# Patient Record
Sex: Male | Born: 1945 | Marital: Married | Smoking: Never smoker
Health system: Southern US, Community
[De-identification: ages and names within clinical notes are randomized; demographics above are authoritative.]

## PROBLEM LIST (undated history)

## (undated) DIAGNOSIS — J3489 Other specified disorders of nose and nasal sinuses: Secondary | ICD-10-CM

## (undated) DIAGNOSIS — I10 Essential (primary) hypertension: Secondary | ICD-10-CM

## (undated) HISTORY — DX: Other specified disorders of nose and nasal sinuses: J34.89

---

## 2016-09-23 DIAGNOSIS — H25013 Cortical age-related cataract, bilateral: Secondary | ICD-10-CM | POA: Diagnosis not present

## 2016-09-23 DIAGNOSIS — H2513 Age-related nuclear cataract, bilateral: Secondary | ICD-10-CM | POA: Diagnosis not present

## 2016-09-23 DIAGNOSIS — H11153 Pinguecula, bilateral: Secondary | ICD-10-CM | POA: Diagnosis not present

## 2016-09-23 DIAGNOSIS — H40013 Open angle with borderline findings, low risk, bilateral: Secondary | ICD-10-CM | POA: Diagnosis not present

## 2016-10-25 DIAGNOSIS — H40013 Open angle with borderline findings, low risk, bilateral: Secondary | ICD-10-CM | POA: Diagnosis not present

## 2016-11-27 DIAGNOSIS — H25013 Cortical age-related cataract, bilateral: Secondary | ICD-10-CM | POA: Diagnosis not present

## 2016-11-27 DIAGNOSIS — H2511 Age-related nuclear cataract, right eye: Secondary | ICD-10-CM | POA: Diagnosis not present

## 2016-11-27 DIAGNOSIS — H25011 Cortical age-related cataract, right eye: Secondary | ICD-10-CM | POA: Diagnosis not present

## 2016-11-27 DIAGNOSIS — H2513 Age-related nuclear cataract, bilateral: Secondary | ICD-10-CM | POA: Diagnosis not present

## 2016-11-27 DIAGNOSIS — H25043 Posterior subcapsular polar age-related cataract, bilateral: Secondary | ICD-10-CM | POA: Diagnosis not present

## 2016-11-27 DIAGNOSIS — H25041 Posterior subcapsular polar age-related cataract, right eye: Secondary | ICD-10-CM | POA: Diagnosis not present

## 2016-12-30 ENCOUNTER — Other Ambulatory Visit: Payer: Self-pay

## 2016-12-30 ENCOUNTER — Ambulatory Visit (INDEPENDENT_AMBULATORY_CARE_PROVIDER_SITE_OTHER): Payer: Medicare Other | Admitting: Physician Assistant

## 2016-12-30 DIAGNOSIS — M5431 Sciatica, right side: Secondary | ICD-10-CM | POA: Diagnosis not present

## 2016-12-30 DIAGNOSIS — M549 Dorsalgia, unspecified: Secondary | ICD-10-CM | POA: Diagnosis not present

## 2016-12-30 LAB — POCT URINALYSIS DIP (MANUAL ENTRY)
Bilirubin, UA: NEGATIVE
Blood, UA: NEGATIVE
Glucose, UA: NEGATIVE mg/dL
Ketones, POC UA: NEGATIVE mg/dL
Leukocytes, UA: NEGATIVE
Nitrite, UA: NEGATIVE
Protein Ur, POC: NEGATIVE mg/dL
Spec Grav, UA: 1.015 (ref 1.010–1.025)
Urobilinogen, UA: 0.2 E.U./dL
pH, UA: 6 (ref 5.0–8.0)

## 2016-12-30 MED ORDER — KETOROLAC TROMETHAMINE 30 MG/ML IJ SOLN
30.0000 mg | Freq: Once | INTRAMUSCULAR | Status: AC
  Administered 2016-12-30: 30 mg via INTRAMUSCULAR

## 2016-12-30 MED ORDER — METHYLPREDNISOLONE ACETATE 80 MG/ML IJ SUSP
40.0000 mg | Freq: Once | INTRAMUSCULAR | Status: AC
  Administered 2016-12-30: 40 mg via INTRAMUSCULAR

## 2016-12-30 MED ORDER — NAPROXEN 500 MG PO TABS: 500.0000 mg | ORAL_TABLET | Freq: Two times a day (BID) | ORAL | 0 refills | Status: DC

## 2016-12-30 MED ORDER — PREDNISONE 20 MG PO TABS: ORAL_TABLET | ORAL | 0 refills | Status: DC

## 2016-12-30 MED ORDER — TRAMADOL-ACETAMINOPHEN 37.5-325 MG PO TABS: 1.0000 | ORAL_TABLET | Freq: Every day | ORAL | 0 refills | Status: DC

## 2016-12-30 NOTE — Patient Instructions (Addendum)
Ultracet is a pain medication. This may make you drowsy - take this at night to help you sleep. It contains Tylenol. Do not take any other medications containing Tylenol (acetaminophin) while taking this.   Naprosyn is an NSAID antiinflammatory. Do not use with any other otc pain medication other than tylenol/acetaminophen - so no aleve, ibuprofen, motrin, advil, etc.  Prednisone will help relieve inflammation of your sciatica. Take this for 9 days.   These medications will not interact - it is okay to take them together if needed.   Stretch your low back daily. Work on core strengthening exercises, ie isometric exercises.  See below for back stretches and posture. Start stretching once your pain is controlled.   If you are not better in 2-3 weeks, come back.   Thank you for coming in today. I hope you feel we met your needs.  Feel free to call PCP if you have any questions or further requests.  Please consider signing up for MyChart if you do not already have it, as this is a great way to communicate with me.  Best,  Whitney McVey, PA-C   Sciatica Sciatica is pain, numbness, weakness, or tingling along the path of the sciatic nerve. The sciatic nerve starts in the lower back and runs down the back of each leg. The nerve controls the muscles in the lower leg and in the back of the knee. It also provides feeling (sensation) to the back of the thigh, the lower leg, and the sole of the foot. Sciatica is a symptom of another medical condition that pinches or puts pressure on the sciatic nerve. Generally, sciatica only affects one side of the body. Sciatica usually goes away on its own or with treatment. In some cases, sciatica may keep coming back (recur). What are the causes? This condition is caused by pressure on the sciatic nerve, or pinching of the sciatic nerve. This may be the result of:  A disk in between the bones of the spine (vertebrae) bulging out too far (herniated  disk).  Age-related changes in the spinal disks (degenerative disk disease).  A pain disorder that affects a muscle in the buttock (piriformis syndrome).  Extra bone growth (bone spur) near the sciatic nerve.  An injury or break (fracture) of the pelvis.  Pregnancy.  Tumor (rare).  What increases the risk? The following factors may make you more likely to develop this condition:  Playing sports that place pressure or stress on the spine, such as football or weight lifting.  Having poor strength and flexibility.  A history of back injury.  A history of back surgery.  Sitting for long periods of time.  Doing activities that involve repetitive bending or lifting.  Obesity.  What are the signs or symptoms? Symptoms can vary from mild to very severe, and they may include:  Any of these problems in the lower back, leg, hip, or buttock: ? Mild tingling or dull aches. ? Burning sensations. ? Sharp pains.  Numbness in the back of the calf or the sole of the foot.  Leg weakness.  Severe back pain that makes movement difficult.  These symptoms may get worse when you cough, sneeze, or laugh, or when you sit or stand for long periods of time. Being overweight may also make symptoms worse. In some cases, symptoms may recur over time. How is this diagnosed? This condition may be diagnosed based on:  Your symptoms.  A physical exam. Your health care provider may ask  you to do certain movements to check whether those movements trigger your symptoms.  You may have tests, including: ? Blood tests. ? X-rays. ? MRI. ? CT scan.  How is this treated? In many cases, this condition improves on its own, without any treatment. However, treatment may include:  Reducing or modifying physical activity during periods of pain.  Exercising and stretching to strengthen your abdomen and improve the flexibility of your spine.  Icing and applying heat to the affected area.  Medicines  that help: ? To relieve pain and swelling. ? To relax your muscles.  Injections of medicines that help to relieve pain, irritation, and inflammation around the sciatic nerve (steroids).  Surgery.  Follow these instructions at home: Medicines  Take over-the-counter and prescription medicines only as told by your health care provider.  Do not drive or operate heavy machinery while taking prescription pain medicine. Managing pain  If directed, apply ice to the affected area. ? Put ice in a plastic bag. ? Place a towel between your skin and the bag. ? Leave the ice on for 20 minutes, 2-3 times a day.  After icing, apply heat to the affected area before you exercise or as often as told by your health care provider. Use the heat source that your health care provider recommends, such as a moist heat pack or a heating pad. ? Place a towel between your skin and the heat source. ? Leave the heat on for 20-30 minutes. ? Remove the heat if your skin turns bright red. This is especially important if you are unable to feel pain, heat, or cold. You may have a greater risk of getting burned. Activity  Return to your normal activities as told by your health care provider. Ask your health care provider what activities are safe for you. ? Avoid activities that make your symptoms worse.  Take brief periods of rest throughout the day. Resting in a lying or standing position is usually better than sitting to rest. ? When you rest for longer periods, mix in some mild activity or stretching between periods of rest. This will help to prevent stiffness and pain. ? Avoid sitting for long periods of time without moving. Get up and move around at least one time each hour.  Exercise and stretch regularly, as told by your health care provider.  Do not lift anything that is heavier than 10 lb (4.5 kg) while you have symptoms of sciatica. When you do not have symptoms, you should still avoid heavy lifting,  especially repetitive heavy lifting.  When you lift objects, always use proper lifting technique, which includes: ? Bending your knees. ? Keeping the load close to your body. ? Avoiding twisting. General instructions  Use good posture. ? Avoid leaning forward while sitting. ? Avoid hunching over while standing.  Maintain a healthy weight. Excess weight puts extra stress on your back and makes it difficult to maintain good posture.  Wear supportive, comfortable shoes. Avoid wearing high heels.  Avoid sleeping on a mattress that is too soft or too hard. A mattress that is firm enough to support your back when you sleep may help to reduce your pain.  Keep all follow-up visits as told by your health care provider. This is important. Contact a health care provider if:  You have pain that wakes you up when you are sleeping.  You have pain that gets worse when you lie down.  Your pain is worse than you have experienced in the  past.  Your pain lasts longer than 4 weeks.  You experience unexplained weight loss. Get help right away if:  You lose control of your bowel or bladder (incontinence).  You have: ? Weakness in your lower back, pelvis, buttocks, or legs that gets worse. ? Redness or swelling of your back. ? A burning sensation when you urinate. This information is not intended to replace advice given to you by your health care provider. Make sure you discuss any questions you have with your health care provider. Document Released: 01/22/2001 Document Revised: 07/04/2015 Document Reviewed: 10/07/2014 Elsevier Interactive Patient Education  2017 [employer] Ask your health care provider which exercises are safe for you. Do exercises exactly as told by your health care provider and adjust them as directed. It is normal to feel mild stretching, pulling, tightness, or discomfort as you do these exercises, but you should stop right away if you feel sudden pain  or your pain gets worse. Do not begin these exercises until told by your health care provider. Stretching and range of motion exercises These exercises warm up your muscles and joints and improve the movement and flexibility of your back. These exercises also help to relieve pain, numbness, and tingling. Exercise A: Lumbar rotation  1. Lie on your back on a firm surface and bend your knees. 2. Straighten your arms out to your sides so each arm forms an "L" shape with a side of your body (a 90 degree angle). 3. Slowly move both of your knees to one side of your body until you feel a stretch in your lower back. Try not to let your shoulders move off of the floor. 4. Hold for __________ seconds. 5. Tense your abdominal muscles and slowly move your knees back to the starting position. 6. Repeat this exercise on the other side of your body. Repeat __________ times. Complete this exercise __________ times a day. Exercise B: Prone extension on elbows  1. Lie on your abdomen on a firm surface. 2. Prop yourself up on your elbows. 3. Use your arms to help lift your chest up until you feel a gentle stretch in your abdomen and your lower back. ? This will place some of your body weight on your elbows. If this is uncomfortable, try stacking pillows under your chest. ? Your hips should stay down, against the surface that you are lying on. Keep your hip and back muscles relaxed. 4. Hold for __________ seconds. 5. Slowly relax your upper body and return to the starting position. Repeat __________ times. Complete this exercise __________ times a day. Strengthening exercises These exercises build strength and endurance in your back. Endurance is the ability to use your muscles for a long time, even after they get tired. Exercise C: Pelvic tilt 1. Lie on your back on a firm surface. Bend your knees and keep your feet flat. 2. Tense your abdominal muscles. Tip your pelvis up toward the ceiling and flatten your  lower back into the floor. ? To help with this exercise, you may place a small towel under your lower back and try to push your back into the towel. 3. Hold for __________ seconds. 4. Let your muscles relax completely before you repeat this exercise. Repeat __________ times. Complete this exercise __________ times a day. Exercise D: Alternating arm and leg raises  1. Get on your hands and knees on a firm surface. If you are on a hard floor, you may want to use  padding to cushion your knees, such as an exercise mat. 2. Line up your arms and legs. Your hands should be below your shoulders, and your knees should be below your hips. 3. Lift your left leg behind you. At the same time, raise your right arm and straighten it in front of you. ? Do not lift your leg higher than your hip. ? Do not lift your arm higher than your shoulder. ? Keep your abdominal and back muscles tight. ? Keep your hips facing the ground. ? Do not arch your back. ? Keep your balance carefully, and do not hold your breath. 4. Hold for __________ seconds. 5. Slowly return to the starting position and repeat with your right leg and your left arm. Repeat __________ times. Complete this exercise __________ times a day. Exercise E: Abdominal set with straight leg raise  1. Lie on your back on a firm surface. 2. Bend one of your knees and keep your other leg straight. 3. Tense your abdominal muscles and lift your straight leg up, 4-6 inches (10-15 cm) off the ground. 4. Keep your abdominal muscles tight and hold for __________ seconds. ? Do not hold your breath. ? Do not arch your back. Keep it flat against the ground. 5. Keep your abdominal muscles tense as you slowly lower your leg back to the starting position. 6. Repeat with your other leg. Repeat __________ times. Complete this exercise __________ times a day. Posture and body mechanics  Body mechanics refers to the movements and positions of your body while you do  your daily activities. Posture is part of body mechanics. Good posture and healthy body mechanics can help to relieve stress in your body's tissues and joints. Good posture means that your spine is in its natural S-curve position (your spine is neutral), your shoulders are pulled back slightly, and your head is not tipped forward. The following are general guidelines for applying improved posture and body mechanics to your everyday activities. Standing   When standing, keep your spine neutral and your feet about hip-width apart. Keep a slight bend in your knees. Your ears, shoulders, and hips should line up.  When you do a task in which you stand in one place for a long time, place one foot up on a stable object that is 2-4 inches (5-10 cm) high, such as a footstool. This helps keep your spine neutral. Sitting   When sitting, keep your spine neutral and keep your feet flat on the floor. Use a footrest, if necessary, and keep your thighs parallel to the floor. Avoid rounding your shoulders, and avoid tilting your head forward.  When working at a desk or a computer, keep your desk at a height where your hands are slightly lower than your elbows. Slide your chair under your desk so you are close enough to maintain good posture.  When working at a computer, place your monitor at a height where you are looking straight ahead and you do not have to tilt your head forward or downward to look at the screen. Resting   When lying down and resting, avoid positions that are most painful for you.  If you have pain with activities such as sitting, bending, stooping, or squatting (flexion-based activities), lie in a position in which your body does not bend very much. For example, avoid curling up on your side with your arms and knees near your chest (fetal position).  If you have pain with activities such as standing for a  long time or reaching with your arms (extension-based activities), lie with your spine  in a neutral position and bend your knees slightly. Try the following positions:  Lying on your side with a pillow between your knees.  Lying on your back with a pillow under your knees. Lifting   When lifting objects, keep your feet at least shoulder-width apart and tighten your abdominal muscles.  Bend your knees and hips and keep your spine neutral. It is important to lift using the strength of your legs, not your back. Do not lock your knees straight out.  Always ask for help to lift heavy or awkward objects. This information is not intended to replace advice given to you by your health care provider. Make sure you discuss any questions you have with your health care provider. Document Released: 01/28/2005 Document Revised: 10/05/2015 Document Reviewed: 11/09/2014 Elsevier Interactive Patient Education  2018 [employer].  IF you received an x-ray today, you will receive an invoice from [facility]. Please contact [facility] at [phone] with questions or concerns regarding your invoice.   IF you received labwork today, you will receive an invoice from [facility]. Please contact [facility] at [phone] with questions or concerns regarding your invoice.   Our billing staff will not be able to assist you with questions regarding bills from these companies.  You will be contacted with the lab results as soon as they are available. The fastest way to get your results is to activate your My Chart account. Instructions are located on the last page of this paperwork. If you have not heard from [place] regarding the results in 2 weeks, please contact this office.

## 2016-12-30 NOTE — Progress Notes (Signed)
   Ian Ryan  MRN: [id number] DOB: 25-Jul-1945  PCP: Patient, No Pcp Per  Subjective:  Pt is a pleasant 71 year old male who presents to clinic for back pain x 2 days. A few days ago he was bending over to take laundry out of his dryer. He is here today with his son. Pain radiates down side of right leg to the knee. 9/10 pain.  He cannot find a comfortable position for sleeping. Pain is worse with bending, standing from seated position, twisting.Ice makes it feel better. He took 400mg  ibuprofen this morning. He is very active - works all day most days of the week.    Denies loss of bowel or bladder function, saddle paresthesia, blood in urine, flank pain, fever, chills, muscle weakness.   Review of Systems  Cardiovascular: Negative for leg swelling.  Gastrointestinal: Negative for abdominal pain, constipation and diarrhea.  Genitourinary: Negative for difficulty urinating, enuresis and hematuria.  Musculoskeletal: Positive for back pain and gait problem. Negative for neck pain and neck stiffness.  Skin: Negative.   Neurological: Negative for weakness and numbness.  Psychiatric/Behavioral: Positive for sleep disturbance.    There are no active problems to display for this patient.   Current Outpatient Medications on File Prior to Visit  Medication Sig Dispense Refill  . PRESCRIPTION MEDICATION [place] from [place]     No current facility-administered medications on file prior to visit.     No Known Allergies   Objective:  There were no vitals taken for this visit.  Physical Exam  Constitutional: He is oriented to person, place, and time and well-developed, well-nourished, and in no distress. No distress.  Abdominal: There is no CVA tenderness.  Musculoskeletal:       Lumbar back: He exhibits decreased range of motion and tenderness. He exhibits no bony tenderness, no deformity and no pain.       Back:  Difficulty finding position of comfort during exam.    Neurological: He is alert and oriented to person, place, and time. He has normal strength and normal reflexes. He has an abnormal Straight Leg Raise Test (right). GCS score is 15.  Skin: Skin is warm and dry.  Psychiatric: Mood, memory, affect and judgment normal.  Vitals reviewed.   Assessment and Plan :  1. Back pain, unspecified back location, unspecified back pain laterality, unspecified chronicity - POCT urinalysis dipstick - traMADol-acetaminophen (ULTRACET) 37.5-325 MG tablet; Take 1-2 tablets at bedtime by mouth. May cause constipation.  Dispense: 10 tablet; Refill: 0 - naproxen (NAPROSYN) 500 MG tablet; Take 1 tablet (500 mg total) 2 (two) times daily with a meal by mouth.  Dispense: 30 tablet; Refill: 0 - ketorolac (TORADOL) 30 MG/ML injection 30 mg  2. Sciatica of right side - predniSONE (DELTASONE) 20 MG tablet; Take 3 PO QAM x3days, 2 PO QAM x3days, 1 PO QAM x3days  Dispense: 18 tablet; Refill: 0 - methylPREDNISolone acetate (DEPO-MEDROL) injection 40 mg - No concern at this time for cauda equina. Plan to treat for sciatica. Supportive care discussed. Start stretching once pain is controlled. RTC in 2-3 weeks if no improvement. Consider imaging or referral.    Marco CollieWhitney Deanne Bedgood, PA-C  [facility] at [facility] 12/30/2016 5:44 PM

## 2016-12-31 ENCOUNTER — Telehealth: Payer: Self-pay | Admitting: *Deleted

## 2016-12-31 NOTE — Telephone Encounter (Signed)
Spoke with patient advised that Tramadol Rx was sent to his pharmacy.

## 2017-01-07 DIAGNOSIS — H2511 Age-related nuclear cataract, right eye: Secondary | ICD-10-CM | POA: Diagnosis not present

## 2017-01-07 DIAGNOSIS — H25811 Combined forms of age-related cataract, right eye: Secondary | ICD-10-CM | POA: Diagnosis not present

## 2017-01-08 DIAGNOSIS — H2512 Age-related nuclear cataract, left eye: Secondary | ICD-10-CM | POA: Diagnosis not present

## 2017-01-14 DIAGNOSIS — H25812 Combined forms of age-related cataract, left eye: Secondary | ICD-10-CM | POA: Diagnosis not present

## 2017-01-14 DIAGNOSIS — H2512 Age-related nuclear cataract, left eye: Secondary | ICD-10-CM | POA: Diagnosis not present

## 2017-01-14 DIAGNOSIS — H2511 Age-related nuclear cataract, right eye: Secondary | ICD-10-CM | POA: Diagnosis not present

## 2019-03-05 DIAGNOSIS — H401131 Primary open-angle glaucoma, bilateral, mild stage: Secondary | ICD-10-CM | POA: Diagnosis not present

## 2019-10-26 DIAGNOSIS — R413 Other amnesia: Secondary | ICD-10-CM | POA: Diagnosis not present

## 2019-10-26 DIAGNOSIS — N400 Enlarged prostate without lower urinary tract symptoms: Secondary | ICD-10-CM | POA: Diagnosis not present

## 2019-10-26 DIAGNOSIS — I10 Essential (primary) hypertension: Secondary | ICD-10-CM | POA: Diagnosis not present

## 2019-10-26 DIAGNOSIS — Z299 Encounter for prophylactic measures, unspecified: Secondary | ICD-10-CM | POA: Diagnosis not present

## 2019-10-26 DIAGNOSIS — R5383 Other fatigue: Secondary | ICD-10-CM | POA: Diagnosis not present

## 2019-11-08 DIAGNOSIS — H35033 Hypertensive retinopathy, bilateral: Secondary | ICD-10-CM | POA: Diagnosis not present

## 2019-11-08 DIAGNOSIS — H401134 Primary open-angle glaucoma, bilateral, indeterminate stage: Secondary | ICD-10-CM | POA: Diagnosis not present

## 2019-11-08 DIAGNOSIS — H524 Presbyopia: Secondary | ICD-10-CM | POA: Diagnosis not present

## 2019-11-08 DIAGNOSIS — H35013 Changes in retinal vascular appearance, bilateral: Secondary | ICD-10-CM | POA: Diagnosis not present

## 2019-11-08 DIAGNOSIS — Z961 Presence of intraocular lens: Secondary | ICD-10-CM | POA: Diagnosis not present

## 2019-11-18 DIAGNOSIS — Z7189 Other specified counseling: Secondary | ICD-10-CM | POA: Diagnosis not present

## 2019-11-18 DIAGNOSIS — Z1339 Encounter for screening examination for other mental health and behavioral disorders: Secondary | ICD-10-CM | POA: Diagnosis not present

## 2019-11-18 DIAGNOSIS — R5383 Other fatigue: Secondary | ICD-10-CM | POA: Diagnosis not present

## 2019-11-18 DIAGNOSIS — Z79899 Other long term (current) drug therapy: Secondary | ICD-10-CM | POA: Diagnosis not present

## 2019-11-18 DIAGNOSIS — Z0000 Encounter for general adult medical examination without abnormal findings: Secondary | ICD-10-CM | POA: Diagnosis not present

## 2019-11-18 DIAGNOSIS — Z713 Dietary counseling and surveillance: Secondary | ICD-10-CM | POA: Diagnosis not present

## 2019-11-18 DIAGNOSIS — Z1331 Encounter for screening for depression: Secondary | ICD-10-CM | POA: Diagnosis not present

## 2019-11-18 DIAGNOSIS — Z299 Encounter for prophylactic measures, unspecified: Secondary | ICD-10-CM | POA: Diagnosis not present

## 2019-11-18 DIAGNOSIS — Z125 Encounter for screening for malignant neoplasm of prostate: Secondary | ICD-10-CM | POA: Diagnosis not present

## 2019-11-18 DIAGNOSIS — Z1211 Encounter for screening for malignant neoplasm of colon: Secondary | ICD-10-CM | POA: Diagnosis not present

## 2019-11-18 DIAGNOSIS — I10 Essential (primary) hypertension: Secondary | ICD-10-CM | POA: Diagnosis not present

## 2019-12-08 DIAGNOSIS — Z23 Encounter for immunization: Secondary | ICD-10-CM | POA: Diagnosis not present

## 2020-09-03 ENCOUNTER — Encounter (HOSPITAL_COMMUNITY): Payer: Self-pay | Admitting: Emergency Medicine

## 2020-09-03 ENCOUNTER — Emergency Department (HOSPITAL_COMMUNITY): Payer: Medicare Other

## 2020-09-03 ENCOUNTER — Emergency Department (HOSPITAL_COMMUNITY)
Admission: EM | Admit: 2020-09-03 | Discharge: 2020-09-03 | Disposition: A | Discharge status: Home / Self Care | Payer: Medicare Other | Attending: Emergency Medicine | Admitting: Emergency Medicine

## 2020-09-03 DIAGNOSIS — I10 Essential (primary) hypertension: Secondary | ICD-10-CM | POA: Diagnosis not present

## 2020-09-03 DIAGNOSIS — R519 Headache, unspecified: Secondary | ICD-10-CM | POA: Diagnosis present

## 2020-09-03 DIAGNOSIS — H5713 Ocular pain, bilateral: Secondary | ICD-10-CM | POA: Diagnosis not present

## 2020-09-03 HISTORY — DX: Essential (primary) hypertension: I10

## 2020-09-03 LAB — CBC WITH DIFFERENTIAL/PLATELET
Abs Immature Granulocytes: 0.02 10*3/uL (ref 0.00–0.07)
Basophils Absolute: 0 10*3/uL (ref 0.0–0.1)
Basophils Relative: 0 %
Eosinophils Absolute: 0.2 10*3/uL (ref 0.0–0.5)
Eosinophils Relative: 4 %
HCT: 39.5 % (ref 39.0–52.0)
Hemoglobin: 13.2 g/dL (ref 13.0–17.0)
Immature Granulocytes: 0 %
Lymphocytes Relative: 37 %
Lymphs Abs: 2 10*3/uL (ref 0.7–4.0)
MCH: 32.5 pg (ref 26.0–34.0)
MCHC: 33.4 g/dL (ref 30.0–36.0)
MCV: 97.3 fL (ref 80.0–100.0)
Monocytes Absolute: 0.5 10*3/uL (ref 0.1–1.0)
Monocytes Relative: 10 %
Neutro Abs: 2.6 10*3/uL (ref 1.7–7.7)
Neutrophils Relative %: 49 %
Platelets: 41 10*3/uL — ABNORMAL LOW (ref 150–400)
RBC: 4.06 MIL/uL — ABNORMAL LOW (ref 4.22–5.81)
RDW: 12.2 % (ref 11.5–15.5)
WBC: 5.3 10*3/uL (ref 4.0–10.5)
nRBC: 0 % (ref 0.0–0.2)

## 2020-09-03 LAB — COMPREHENSIVE METABOLIC PANEL
ALT: 17 U/L (ref 0–44)
AST: 19 U/L (ref 15–41)
Albumin: 3.8 g/dL (ref 3.5–5.0)
Alkaline Phosphatase: 71 U/L (ref 38–126)
Anion gap: 6 (ref 5–15)
BUN: 9 mg/dL (ref 8–23)
CO2: 26 mmol/L (ref 22–32)
Calcium: 10 mg/dL (ref 8.9–10.3)
Chloride: 103 mmol/L (ref 98–111)
Creatinine, Ser: 1.06 mg/dL (ref 0.61–1.24)
GFR, Estimated: >60 mL/min (ref >60)
Glucose, Bld: 103 mg/dL — ABNORMAL HIGH (ref 70–99)
Potassium: 4.9 mmol/L (ref 3.5–5.1)
Sodium: 135 mmol/L (ref 135–145)
Total Bilirubin: 0.8 mg/dL (ref 0.3–1.2)
Total Protein: 7.4 g/dL (ref 6.5–8.1)

## 2020-09-03 MED ORDER — TETRACAINE HCL 0.5 % OP SOLN
2.0000 [drp] | Freq: Once | OPHTHALMIC | Status: AC
  Administered 2020-09-03: 2 [drp] via OPHTHALMIC
  Filled 2020-09-03: qty 4

## 2020-09-03 MED ORDER — LOSARTAN POTASSIUM 50 MG PO TABS: 75.0000 mg | ORAL_TABLET | Freq: Every day | ORAL | 0 refills | Status: DC

## 2020-09-03 NOTE — ED Provider Notes (Signed)
Emergency Medicine Provider Triage Evaluation Note  Ian Ryan , a 75 y.o. male  was evaluated in triage.  Pt complains of headache and bilateral eye pain.  Has been present for to 5 days.  Nothing makes it better or worse.  He has been taking blood pressure medication as prescribed.  Review of Systems  Positive: Ha, eye pain Negative: Vision changes  Physical Exam  BP (!) 168/74   Pulse 66   Temp 99 F (37.2 C) (Oral)   Resp 14   SpO2 100%  Gen:   Awake, no distress   Resp:  Normal effort  MSK:   Moves extremities without difficulty  Other:  EOMI, CN intact.  Strength and sensation intact x4.  Medical Decision Making  Medically screening exam initiated at 12:11 PM.  Appropriate orders placed.  Ian Ryan was informed that the remainder of the evaluation will be completed by another provider, this initial triage assessment does not replace that evaluation, and the importance of remaining in the ED until their evaluation is complete.  Labs, ct   Alveria Apley, PA-C 09/03/20 1212    Cheryll Cockayne, MD 09/05/20 [phone]

## 2020-09-03 NOTE — ED Triage Notes (Signed)
C/o frontal headache and pain to bilateral eyes x 4-5 days.  Denies nausea, vomiting or any other associated symptoms.

## 2020-09-03 NOTE — ED Notes (Signed)
EDP at bedside with [place].

## 2020-09-03 NOTE — Discharge Instructions (Addendum)
You came to the ED with headache and eye pain for 4-5 days. Your symptoms were intermittent. CT scan of the head was negative for any problems. I believe this may be secondary to a high blood pressure. I would advise you to take 75 mg of losartan until you see your PCP for better BP control. You can take OTC headache medication for your headache (combo acetaminophen, aspirin, caffeine). Follow up with your PCP for further evaluation. If you get chest pain, shortness of breath, severe headache, vision loss please come back to the ED.

## 2020-09-03 NOTE — ED Provider Notes (Signed)
[facility] EMERGENCY DEPARTMENT Provider Note   CSN: [id number] Arrival date & time: 09/03/20  1121     History No chief complaint on file.   Boyd Spatafore is a 75 y.o. male with past medical history of hypertension presenting to the ED for headache and bilateral eye pain for 4-5 days. The pain comes and goes but is worse in the afternoon and at night. The pain is located in the front of the head and in the eyes bilaterally. The headache is rated as 3/10 in intensity and pain in the eye is rated as 6/10. The headache is described as dull pain but eye pain is described as sharper pain. Patient denies any other symptoms but does state he does not check his blood pressure at home. He only took 1 tylenol so far for this headache but states it did not help. He did have a eye exam last month and that was normal. His blood pressure was elevated in the ED with 170/74 when I was in the room.       Past Medical History:  Diagnosis Date   Hypertension     There are no problems to display for this patient.   History reviewed. No pertinent surgical history.     No family history on file.  Social History   Tobacco Use   Smoking status: Never   Smokeless tobacco: Never  Substance Use Topics   Alcohol use: Not Currently   Drug use: Not Currently    Home Medications Prior to Admission medications   Medication Sig Start Date End Date Taking? Authorizing Provider  naproxen (NAPROSYN) 500 MG tablet Take 1 tablet (500 mg total) 2 (two) times daily with a meal by mouth. 12/30/16   McVey, Madelaine Bhat, PA-C  predniSONE (DELTASONE) 20 MG tablet Take 3 PO QAM x3days, 2 PO QAM x3days, 1 PO QAM x3days 12/30/16   McVey, Madelaine Bhat, PA-C  PRESCRIPTION MEDICATION Repay blood pressure med from [place]    [provider]  traMADol-acetaminophen (ULTRACET) 37.5-325 MG tablet Take 1-2 tablets at bedtime by mouth. May cause constipation. 12/30/16   McVey,  Madelaine Bhat, PA-C    Allergies    Patient has no known allergies.  Review of Systems   Review of Systems  Constitutional:  Negative for chills and fever.  HENT:  Negative for ear pain and sore throat.   Eyes:  Positive for pain and itching. Negative for visual disturbance.  Respiratory:  Negative for cough and shortness of breath.   Cardiovascular:  Negative for chest pain and palpitations.  Gastrointestinal:  Negative for abdominal pain and vomiting.  Genitourinary:  Negative for dysuria and hematuria.  Musculoskeletal:  Positive for back pain. Negative for arthralgias.  Skin:  Negative for color change and rash.  Neurological:  Negative for seizures and syncope.  Psychiatric/Behavioral:  Negative for agitation and behavioral problems.   All other systems reviewed and are negative.  Physical Exam Updated Vital Signs BP (!) 170/74   Pulse 62   Temp 98.7 F (37.1 C) (Oral)   Resp 18   SpO2 100%   Physical Exam Vitals and nursing note reviewed.  Constitutional:      Appearance: Normal appearance. He is well-developed.  HENT:     Head: Normocephalic and atraumatic.     Mouth/Throat:     Mouth: Mucous membranes are moist.     Pharynx: Oropharynx is clear. No posterior oropharyngeal erythema.  Eyes:     Extraocular Movements: Extraocular  movements intact.     Conjunctiva/sclera: Conjunctivae normal.     Pupils: Pupils are equal, round, and reactive to light.  Cardiovascular:     Rate and Rhythm: Normal rate and regular rhythm.     Heart sounds: No murmur heard. Pulmonary:     Effort: Pulmonary effort is normal. No respiratory distress.     Breath sounds: Normal breath sounds.  Abdominal:     Palpations: Abdomen is soft.     Tenderness: There is no abdominal tenderness.  Musculoskeletal:        General: No swelling or tenderness. Normal range of motion.     Cervical back: Normal range of motion and neck supple.  Skin:    General: Skin is warm and dry.      Capillary Refill: Capillary refill takes less than 2 seconds.  Neurological:     General: No focal deficit present.     Mental Status: He is alert and oriented to person, place, and time.  Psychiatric:        Mood and Affect: Mood normal.        Behavior: Behavior normal.    ED Results / Procedures / Treatments   Labs (all labs ordered are listed, but only abnormal results are displayed) Labs Reviewed  CBC WITH DIFFERENTIAL/PLATELET - Abnormal; Notable for the following components:      Result Value   RBC 4.06 (*)    Platelets 41 (*)    All other components within normal limits  COMPREHENSIVE METABOLIC PANEL - Abnormal; Notable for the following components:   Glucose, Bld 103 (*)    All other components within normal limits    EKG None  Radiology CT Head Wo Contrast  Result Date: 09/03/2020 CLINICAL DATA:  Headache, new or worsening. Frontal headache and bilateral orbital pain for 5 days. No reported injury. EXAM: CT HEAD WITHOUT CONTRAST TECHNIQUE: Contiguous axial images were obtained from the base of the skull through the vertex without intravenous contrast. COMPARISON:  None. FINDINGS: Brain: Generalized cerebral volume loss. Nonspecific mild subcortical and periventricular white matter hypodensity, most in keeping with chronic small vessel ischemic change. No evidence of parenchymal hemorrhage or extra-axial fluid collection. No mass lesion, mass effect, or midline shift. No CT evidence of acute infarction. No ventriculomegaly. Vascular: No acute abnormality. Skull: No evidence of calvarial fracture. Sinuses/Orbits: The visualized paranasal sinuses are essentially clear. Other:  The mastoid air cells are unopacified. IMPRESSION: 1. No evidence of acute intracranial abnormality. 2. Generalized cerebral volume loss and mild chronic small vessel ischemic changes in the cerebral white matter. Electronically Signed   By: Delbert Phenix M.D.   On: 09/03/2020 13:12     Procedures Procedures   Medications Ordered in ED Medications - No data to display  ED Course  I have reviewed the triage vital signs and the nursing notes.  Pertinent labs & imaging results that were available during my care of the patient were reviewed by me and considered in my medical decision making (see chart for details).    MDM Rules/Calculators/A&P                           Headache/Eye Pain Patient has headache and eye pain for 4-5 days. He denies any symptoms before this. He does not know an inciting event. The symptoms are intermittent. The headache is dull in nature and 3/10 in intensity. The eye pain is 6/10 and sharper in nature. Patient has blood  pressure that is not controlled. He is currently taking losartan 50 mg. He only took 1 tylenol for the headache and stated it did not relieve it. His worry is for a tumor.  Plan: -BP control with outpatient follow up. -CT was normal for any acute findings -Headache relief otc as patient only took 1 tylenol.   Final Clinical Impression(s) / ED Diagnoses Final diagnoses:  None    Rx / DC Orders ED Discharge Orders     None        Gwenevere Abbot, MD 09/03/20 Flossie Buffy    Alvira Monday, MD 09/04/20 [phone]

## 2020-11-21 DIAGNOSIS — F17210 Nicotine dependence, cigarettes, uncomplicated: Secondary | ICD-10-CM | POA: Diagnosis not present

## 2020-11-21 DIAGNOSIS — Z6824 Body mass index (BMI) 24.0-24.9, adult: Secondary | ICD-10-CM | POA: Diagnosis not present

## 2020-11-21 DIAGNOSIS — Z79899 Other long term (current) drug therapy: Secondary | ICD-10-CM | POA: Diagnosis not present

## 2020-11-21 DIAGNOSIS — Z0000 Encounter for general adult medical examination without abnormal findings: Secondary | ICD-10-CM | POA: Diagnosis not present

## 2020-11-21 DIAGNOSIS — Z299 Encounter for prophylactic measures, unspecified: Secondary | ICD-10-CM | POA: Diagnosis not present

## 2020-11-21 DIAGNOSIS — Z7189 Other specified counseling: Secondary | ICD-10-CM | POA: Diagnosis not present

## 2020-11-21 DIAGNOSIS — R5383 Other fatigue: Secondary | ICD-10-CM | POA: Diagnosis not present

## 2020-11-21 DIAGNOSIS — I10 Essential (primary) hypertension: Secondary | ICD-10-CM | POA: Diagnosis not present

## 2020-11-28 DIAGNOSIS — Z1212 Encounter for screening for malignant neoplasm of rectum: Secondary | ICD-10-CM | POA: Diagnosis not present

## 2020-11-28 DIAGNOSIS — Z1211 Encounter for screening for malignant neoplasm of colon: Secondary | ICD-10-CM | POA: Diagnosis not present

## 2021-08-09 DIAGNOSIS — D696 Thrombocytopenia, unspecified: Secondary | ICD-10-CM | POA: Diagnosis not present

## 2021-08-09 DIAGNOSIS — Z789 Other specified health status: Secondary | ICD-10-CM | POA: Diagnosis not present

## 2021-08-09 DIAGNOSIS — Z299 Encounter for prophylactic measures, unspecified: Secondary | ICD-10-CM | POA: Diagnosis not present

## 2021-08-09 DIAGNOSIS — I739 Peripheral vascular disease, unspecified: Secondary | ICD-10-CM | POA: Diagnosis not present

## 2021-08-09 DIAGNOSIS — I10 Essential (primary) hypertension: Secondary | ICD-10-CM | POA: Diagnosis not present

## 2021-11-30 DIAGNOSIS — Z0000 Encounter for general adult medical examination without abnormal findings: Secondary | ICD-10-CM | POA: Diagnosis not present

## 2021-11-30 DIAGNOSIS — E7800 Pure hypercholesterolemia, unspecified: Secondary | ICD-10-CM | POA: Diagnosis not present

## 2021-11-30 DIAGNOSIS — I739 Peripheral vascular disease, unspecified: Secondary | ICD-10-CM | POA: Diagnosis not present

## 2021-11-30 DIAGNOSIS — Z79899 Other long term (current) drug therapy: Secondary | ICD-10-CM | POA: Diagnosis not present

## 2021-11-30 DIAGNOSIS — D696 Thrombocytopenia, unspecified: Secondary | ICD-10-CM | POA: Diagnosis not present

## 2021-11-30 DIAGNOSIS — R059 Cough, unspecified: Secondary | ICD-10-CM | POA: Diagnosis not present

## 2021-11-30 DIAGNOSIS — Z7189 Other specified counseling: Secondary | ICD-10-CM | POA: Diagnosis not present

## 2021-11-30 DIAGNOSIS — Z6824 Body mass index (BMI) 24.0-24.9, adult: Secondary | ICD-10-CM | POA: Diagnosis not present

## 2021-11-30 DIAGNOSIS — Z23 Encounter for immunization: Secondary | ICD-10-CM | POA: Diagnosis not present

## 2021-11-30 DIAGNOSIS — Z299 Encounter for prophylactic measures, unspecified: Secondary | ICD-10-CM | POA: Diagnosis not present

## 2021-11-30 DIAGNOSIS — R5383 Other fatigue: Secondary | ICD-10-CM | POA: Diagnosis not present

## 2021-11-30 DIAGNOSIS — I10 Essential (primary) hypertension: Secondary | ICD-10-CM | POA: Diagnosis not present

## 2023-06-01 IMAGING — CT CT HEAD W/O CM
4 series · 16 of 47 positions shown, 18 images · non-contrast
Comparison: None.

CLINICAL DATA: Headache, new or worsening. Frontal headache and
bilateral orbital pain for 5 days. No reported injury.

EXAM:
CT HEAD WITHOUT CONTRAST
TECHNIQUE: Contiguous axial images were obtained from the base of the skull
through the vertex without intravenous contrast.

[Series 3: head without · axial · non-contrast · 0.42mm/px · z∈[-18,+97]mm · 7 of 31 slices shown, 9 images]
[im 4/31  brain]
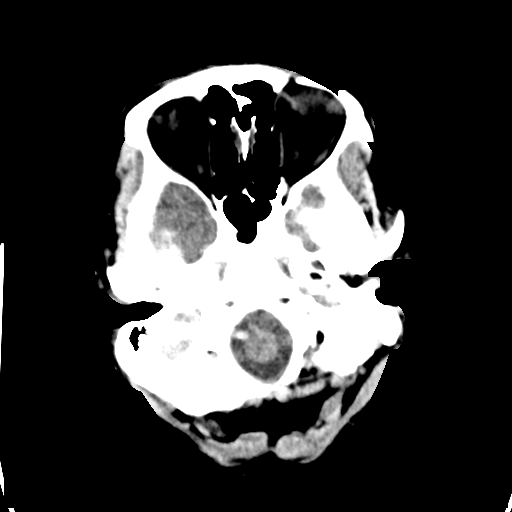
[im 4/31  bone]
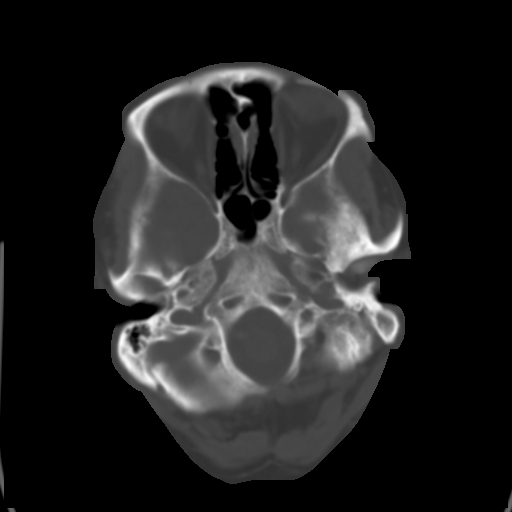
[im 8/31  brain]
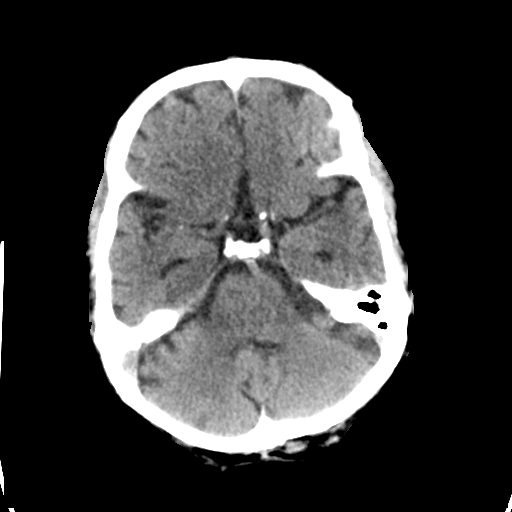
[im 12/31  brain]
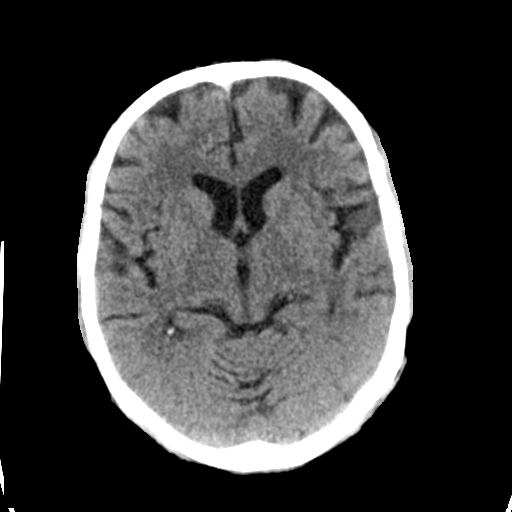
[im 16/31  brain]
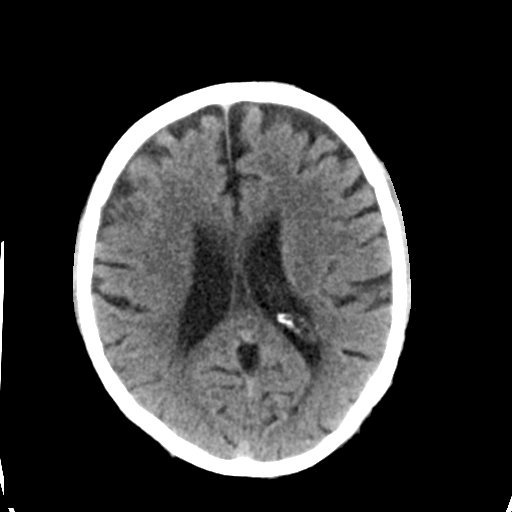
[im 19/31  brain]
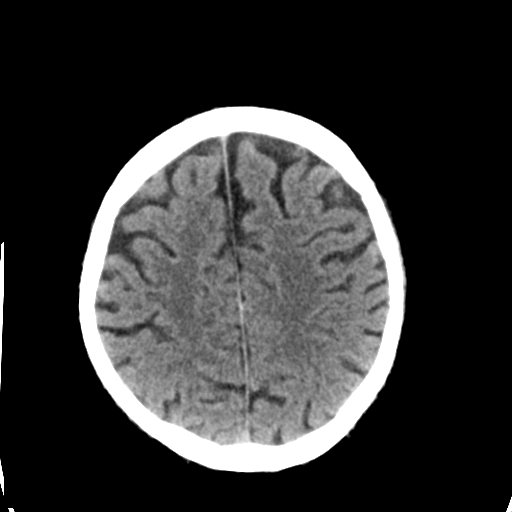
[im 19/31  bone]
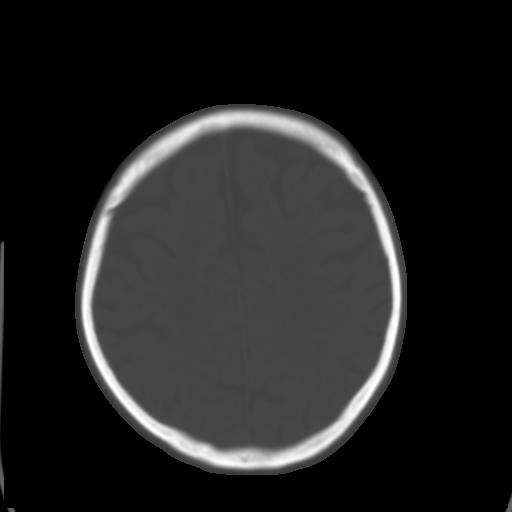
[im 23/31  brain]
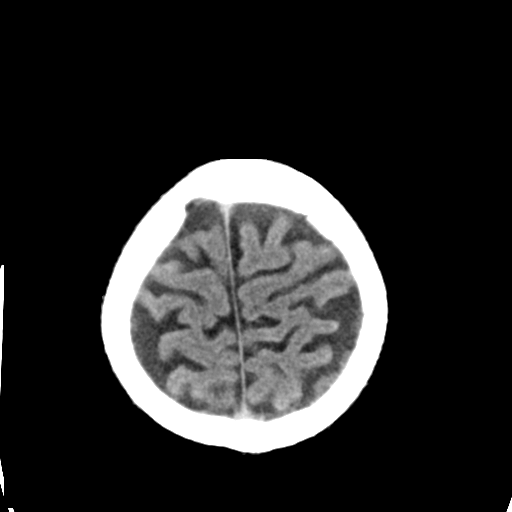
[im 27/31  brain]
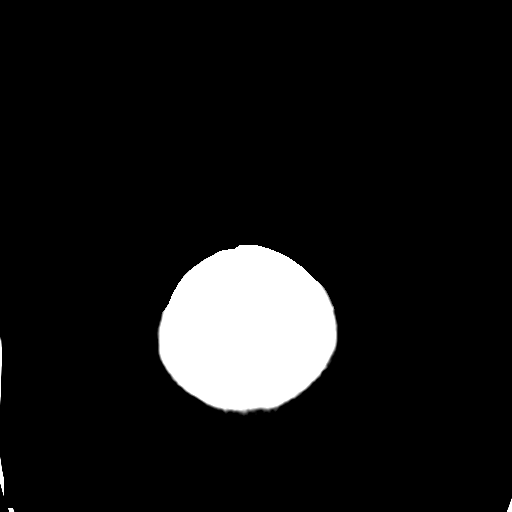

[Series 4: ax head bone · axial · 0.42mm/px · z∈[-16,+13]mm · 3 of 77 slices shown]
[im 8/77  bone]
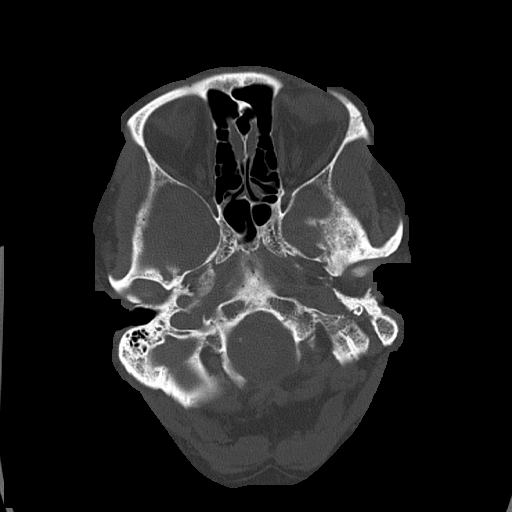
[im 16/77  bone]
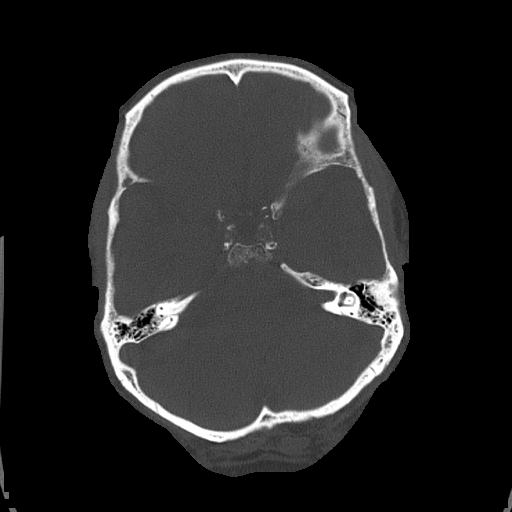
[im 23/77  bone]
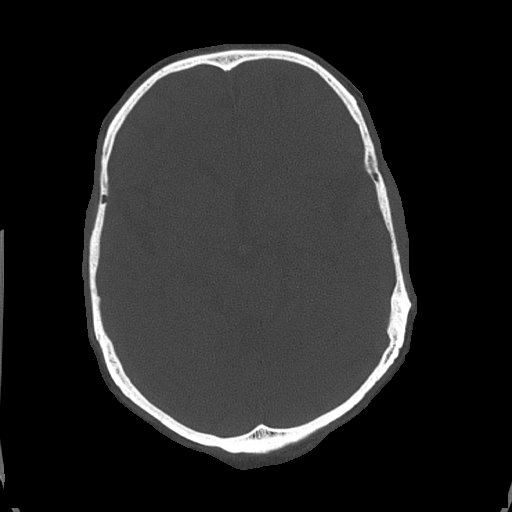

[Series 5: head without cor · coronal · non-contrast · 0.32mm/px · 3 of 60 slices shown]
[im 20/60  brain]
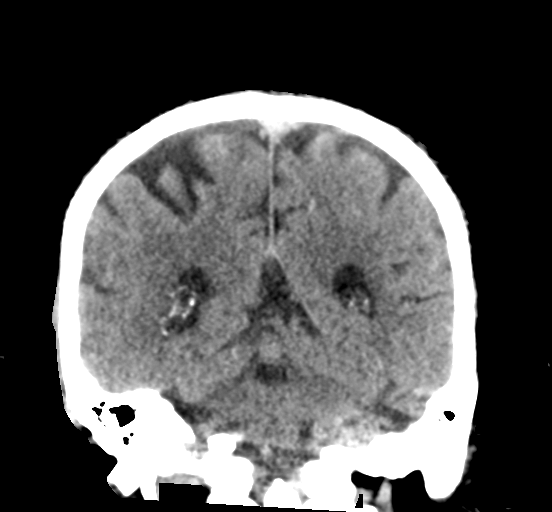
[im 27/60  brain]
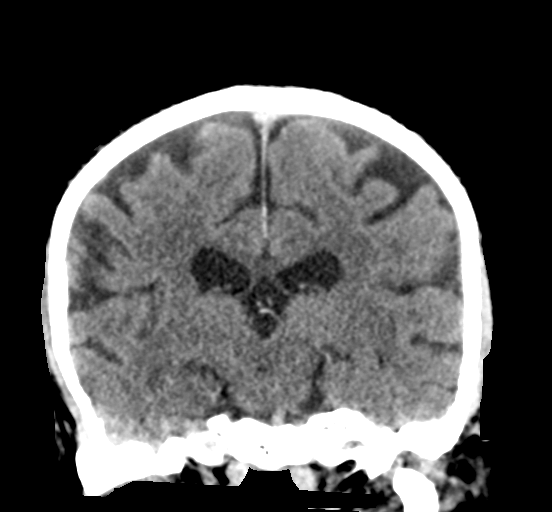
[im 33/60  brain]
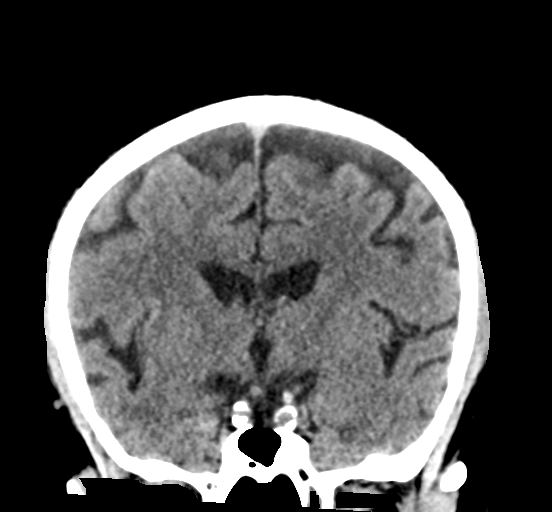

[Series 6: head without sag · sagittal · non-contrast · 0.29mm/px · 3 of 47 slices shown]
[im 16/47  brain]
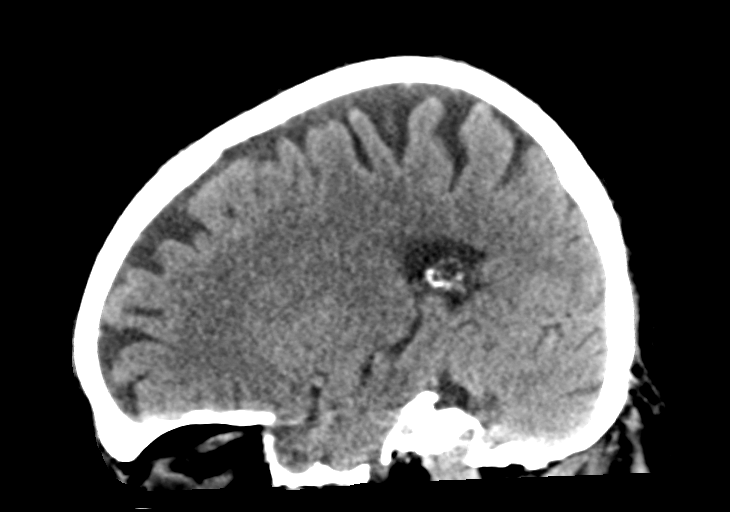
[im 24/47  brain]
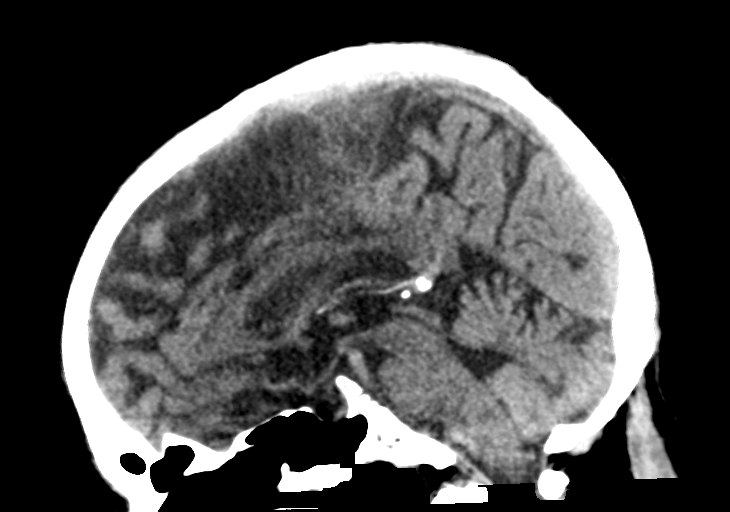
[im 31/47  brain]
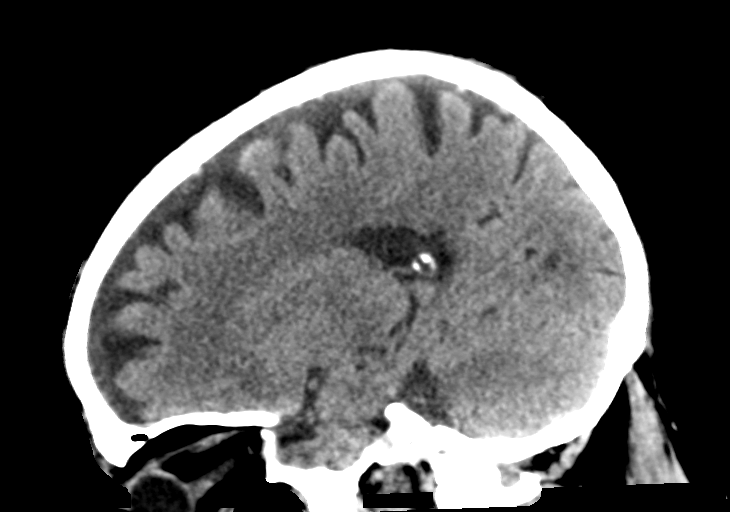

[16 of 47 positions shown; findings below may reference images not displayed]

FINDINGS: Brain: Generalized cerebral volume loss. Nonspecific mild
subcortical and periventricular white matter hypodensity, most in
keeping with chronic small vessel ischemic change. No evidence of
parenchymal hemorrhage or extra-axial fluid collection. No mass
lesion, mass effect, or midline shift. No CT evidence of acute
infarction. No ventriculomegaly.

Vascular: No acute abnormality.

Skull: No evidence of calvarial fracture.

Sinuses/Orbits: The visualized paranasal sinuses are essentially
clear.

Other:  The mastoid air cells are unopacified.
IMPRESSION: 1. No evidence of acute intracranial abnormality.
2. Generalized cerebral volume loss and mild chronic small vessel
ischemic changes in the cerebral white matter.

## 2023-07-15 DIAGNOSIS — D696 Thrombocytopenia, unspecified: Secondary | ICD-10-CM | POA: Diagnosis not present

## 2023-07-15 DIAGNOSIS — I10 Essential (primary) hypertension: Secondary | ICD-10-CM | POA: Diagnosis not present

## 2023-07-15 DIAGNOSIS — M25562 Pain in left knee: Secondary | ICD-10-CM | POA: Diagnosis not present

## 2023-07-15 DIAGNOSIS — C12 Malignant neoplasm of pyriform sinus: Secondary | ICD-10-CM | POA: Diagnosis not present

## 2023-07-15 DIAGNOSIS — Z299 Encounter for prophylactic measures, unspecified: Secondary | ICD-10-CM | POA: Diagnosis not present

## 2023-08-05 DIAGNOSIS — Z7189 Other specified counseling: Secondary | ICD-10-CM | POA: Diagnosis not present

## 2023-08-05 DIAGNOSIS — E7800 Pure hypercholesterolemia, unspecified: Secondary | ICD-10-CM | POA: Diagnosis not present

## 2023-08-05 DIAGNOSIS — Z299 Encounter for prophylactic measures, unspecified: Secondary | ICD-10-CM | POA: Diagnosis not present

## 2023-08-05 DIAGNOSIS — Z1389 Encounter for screening for other disorder: Secondary | ICD-10-CM | POA: Diagnosis not present

## 2023-08-05 DIAGNOSIS — Z79899 Other long term (current) drug therapy: Secondary | ICD-10-CM | POA: Diagnosis not present

## 2023-08-05 DIAGNOSIS — Z0000 Encounter for general adult medical examination without abnormal findings: Secondary | ICD-10-CM | POA: Diagnosis not present

## 2023-08-05 DIAGNOSIS — I10 Essential (primary) hypertension: Secondary | ICD-10-CM | POA: Diagnosis not present

## 2023-08-05 DIAGNOSIS — R5383 Other fatigue: Secondary | ICD-10-CM | POA: Diagnosis not present

## 2023-08-12 ENCOUNTER — Encounter: Payer: Self-pay | Admitting: Gastroenterology

## 2023-08-12 NOTE — Progress Notes (Signed)
 [facility] [address], [place] [address]   Clinic Day:  08/13/2023  Referring physician: Rosamond Leta NOVAK, MD  Patient Care Team: Rosamond Leta NOVAK, MD as PCP - General (Internal Medicine)   ASSESSMENT & PLAN:   Assessment:  1.  Severe pancytopenia: - Patient seen at the request of Dr. Rosamond - He had a history of thrombocytopenia for several years with platelet count ranging between 40-65 since 2022. - Recent CBC on 08/05/2023: WBC-3.2, ANC-1.3, (39% N, 42% L, 11% M), Hb-8.4, PLT-8, MCV-108. - 12/01/2021: WBC-4.8, Hb-12.4, PLT-65, MCV-97, ANC-2.2 - 11/22/2020: WBC-4.7, Hb-13.1, PLT-49 - 09/03/2020: WBC-5.3, Hb-13.2, PLT-41 - No prior history of transfusions.  No recent infections.  No new medications.  Denies any bleeding or bruising.  He lost about 11 pounds in the last 6 months.  He reportedly had a bone marrow biopsy in 2022 in [place] but reports are not available.  He reports taking B12 supplements in the past until a year ago.  2.  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 - PET scan (04/03/2022): Left pyriform fossa and AE fold mass lesion infiltrating epiglottis.  Left level 2 and 3 lymph nodes present. - Underwent chemoradiation therapy with Nimotuzumab (EGFR antibody) and carboplatin. - Subsequent PET scan in July 2024 and March 2025 did not show any active disease.  3.  Social/family history: - He was seen with his son today.  He is a non-smoker. - No family history of cancer or leukemia.  Plan:  1.  Severe thrombocytopenia/macrocytic anemia: - We have discussed differential diagnosis including MDS/leukemia. - Will repeat CBC today.  If the platelet count is severely low, will consider platelet transfusion.  Will check for nutritional deficiencies, bone marrow infiltrative process.  Will also send for flow cytometry and NGS panel. - We have also talked about need for bone marrow biopsy.   Orders Placed This  Encounter  Procedures   CBC with Differential    Standing Status:   Future    Number of Occurrences:   1    Expected Date:   08/13/2023    Expiration Date:   11/11/2023   Lactate dehydrogenase    Standing Status:   Future    Number of Occurrences:   1    Expected Date:   08/13/2023    Expiration Date:   11/11/2023   Reticulocytes    Standing Status:   Future    Number of Occurrences:   1    Expected Date:   08/13/2023    Expiration Date:   11/11/2023   Iron and TIBC ([facility] ONLY)    Standing Status:   Future    Number of Occurrences:   1    Expected Date:   08/13/2023    Expiration Date:   11/11/2023   Ferritin    Standing Status:   Future    Number of Occurrences:   1    Expected Date:   08/13/2023    Expiration Date:   11/11/2023   Vitamin B12    Standing Status:   Future    Number of Occurrences:   1    Expected Date:   08/13/2023    Expiration Date:   11/11/2023   Folate    Standing Status:   Future    Number of Occurrences:   1    Expected Date:   08/13/2023    Expiration Date:   11/11/2023  Kappa/lambda light chains    Standing Status:   Future    Number of Occurrences:   1    Expected Date:   08/13/2023    Expiration Date:   11/11/2023   Immunofixation electrophoresis    Standing Status:   Future    Number of Occurrences:   1    Expected Date:   08/13/2023    Expiration Date:   11/11/2023   Protein electrophoresis, serum    Standing Status:   Future    Number of Occurrences:   1    Expected Date:   08/13/2023    Expiration Date:   11/11/2023   Methylmalonic acid, serum    Standing Status:   Future    Number of Occurrences:   1    Expected Date:   08/13/2023    Expiration Date:   11/11/2023   Copper, serum    Standing Status:   Future    Number of Occurrences:   1    Expected Date:   08/13/2023    Expiration Date:   11/11/2023   Flow Cytometry, Peripheral Blood (Oncology)    Standing Status:   Future    Number of Occurrences:   1    Expected Date:   08/13/2023     Expiration Date:   11/11/2023   Miscellaneous [facility] test (send-out)    Standing Status:   Future    Number of Occurrences:   1    Expected Date:   08/13/2023    Expiration Date:   11/11/2023    Test name / description::   Myeloid NGS: [id number]    Release to patient:   Immediate   Sample to Blood Bank(Blood Bank Hold)    Standing Status:   Future    Number of Occurrences:   1    Expected Date:   08/13/2023    Expiration Date:   08/12/2024   ABO/Rh    Standing Status:   Future    Number of Occurrences:   1    Expected Date:   08/13/2023    Expiration Date:   08/12/2024      LILLETTE Hummingbird R Teague,acting as a scribe for [name], MD.,have documented all relevant documentation on the behalf of [name], MD,as directed by  [name], MD while in the presence of [name], MD.   I, [name] MD, have reviewed the above documentation for accuracy and completeness, and I agree with the above.   [name], MD   7/2/20254:00 PM  CHIEF COMPLAINT/PURPOSE OF CONSULT:   Diagnosis: Severe thrombocytopenia and macrocytic anemia  Current Therapy: Under workup  HISTORY OF PRESENT ILLNESS:   Ian Ryan is a 78 y.o. male presenting to clinic today for evaluation of pancytopenia including anemia, leukopenia, and thrombocytopenia at the request of Vyas, Dhruv B, MD.  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   Mccartney was seen by his PCP on 08/05/23 for an annual physical after reestablishing care 3 weeks ago as he was living in [place] for almost 2 years prior.  CBC diff from 08/05/23 showed low WBC at 3.2, low RBC at 2.46, low HGB at 8.4, low HCT at 26.5, elevated MCV at 108, elevated MCH at 34.1, low platelets at 8, and low absolute neutrophils at 1.3. LFTs from 08/05/23 were normal.   CT of the neck from 03/2022 showed a 30 x 18  x 32  mm lesion on the left side of the epiglottis, malignant left pyriform fossa and AE fold mass lesion infiltrating the epiglottis with some metastatic lymph nodes at level 2 and 3. Recent CT scan shows no recurrent disease and lymph nodes appear benign per Dr. Rosamond' note.    Today, he states that he is doing well overall. His appetite level is at 70%. His energy level is at 100%.  PAST MEDICAL HISTORY:   Past Medical History: Past Medical History:  Diagnosis Date   Hypertension     Surgical History: No past surgical history on file.  Social History: Social History   Socioeconomic History   Marital status: Married    Spouse name: Not on file   Number of children: Not on file   Years of education: Not on file   Highest education level: Not on file  Occupational History   Not on file  Tobacco Use   Smoking status: Never   Smokeless tobacco: Never  Substance and Sexual Activity   Alcohol  use: Not Currently   Drug use: Not Currently   Sexual activity: Not on file  Other Topics Concern   Not on file  Social History Narrative   Not on file   Social Drivers of Health   Financial Resource Strain: Not on file  Food Insecurity: Not on file  Transportation Needs: Not on file  Physical Activity: Not on file  Stress: Not on file  Social Connections: Not on file  Intimate Partner Violence: Not on file    Family History: No family history on file.  Current Medications:  Current Outpatient Medications:    losartan  (COZAAR ) 50 MG tablet, Take 1.5 tablets (75 mg total) by mouth daily., Disp: 45 tablet, Rfl: 0   pantoprazole (PROTONIX) 40 MG tablet, Take 40 mg by mouth daily., Disp: , Rfl:    PRESCRIPTION MEDICATION, Repay blood pressure med from [place], Disp: , Rfl:    traMADol -acetaminophen  (ULTRACET ) 37.5-325 MG tablet, Take 1-2 tablets at bedtime by mouth. May cause constipation., Disp: 10 tablet, Rfl: 0   Allergies: No Known Allergies  REVIEW OF SYSTEMS:   Review  of Systems  Constitutional:  Negative for chills, fatigue and fever.  HENT:   Positive for trouble swallowing. Negative for lump/mass, mouth sores, nosebleeds and sore throat.   Eyes:  Negative for eye problems.  Respiratory:  Positive for cough. Negative for shortness of breath.   Cardiovascular:  Negative for chest pain, leg swelling and palpitations.  Gastrointestinal:  Negative for abdominal pain, constipation, diarrhea, nausea and vomiting.  Genitourinary:  Negative for bladder incontinence, difficulty urinating, dysuria, frequency, hematuria and nocturia.   Musculoskeletal:  Positive for back pain. Negative for arthralgias, flank pain, myalgias and neck pain.  Skin:  Negative for itching and rash.  Neurological:  Negative for dizziness, headaches and numbness.  Hematological:  Does not bruise/bleed easily.  Psychiatric/Behavioral:  Positive for sleep disturbance. Negative for depression and suicidal ideas. The patient is not nervous/anxious.   All other systems reviewed and are negative.    VITALS:   Blood pressure 135/61, pulse 62, temperature (!) 97.4 F (36.3 C), temperature source Oral, resp. rate 18, weight 120 lb 2.4 oz (54.5 kg), SpO2 100%.  Wt Readings from Last 3 Encounters:  08/13/23 120 lb 2.4 oz (54.5 kg)    There is no height or weight on file to calculate BMI.   PHYSICAL EXAM:   Physical Exam Vitals and nursing note reviewed. Exam conducted with a chaperone present.  Constitutional:      Appearance: Normal appearance.  Cardiovascular:     Rate and Rhythm: Normal rate and regular rhythm.     Pulses: Normal pulses.     Heart sounds: Normal heart sounds.  Pulmonary:     Effort: Pulmonary effort is normal.     Breath sounds: Normal breath sounds.  Abdominal:     Palpations: Abdomen is soft. There is no hepatomegaly, splenomegaly or mass.     Tenderness: There is no abdominal tenderness.  Musculoskeletal:     Right lower leg: No edema.     Left lower leg: No  edema.  Lymphadenopathy:     Cervical: No cervical adenopathy.     Right cervical: No superficial, deep or posterior cervical adenopathy.    Left cervical: No superficial, deep or posterior cervical adenopathy.     Upper Body:     Right upper body: No supraclavicular or axillary adenopathy.     Left upper body: No supraclavicular or axillary adenopathy.  Neurological:     General: No focal deficit present.     Mental Status: He is alert and oriented to person, place, and time.  Psychiatric:        Mood and Affect: Mood normal.        Behavior: Behavior normal.     LABS:   CBC    Component Value Date/Time   WBC 5.1 08/13/2023 1405   RBC 2.44 (L) 08/13/2023 1406   RBC 2.39 (L) 08/13/2023 1405   HGB 8.7 (L) 08/13/2023 1405   HCT 25.7 (L) 08/13/2023 1405   PLT 9 (LL) 08/13/2023 1405   MCV 107.5 (H) 08/13/2023 1405   MCH 36.4 (H) 08/13/2023 1405   MCHC 33.9 08/13/2023 1405   RDW 15.3 08/13/2023 1405   LYMPHSABS 1.6 08/13/2023 1405   MONOABS 0.5 08/13/2023 1405   EOSABS 0.3 08/13/2023 1405   BASOSABS 0.0 08/13/2023 1405    CMP    Component Value Date/Time   NA 135 09/03/2020 1214   K 4.9 09/03/2020 1214   CL 103 09/03/2020 1214   CO2 26 09/03/2020 1214   GLUCOSE 103 (H) 09/03/2020 1214   BUN 9 09/03/2020 1214   CREATININE 1.06 09/03/2020 1214   CALCIUM 10.0 09/03/2020 1214   PROT 7.4 09/03/2020 1214   ALBUMIN 3.8 09/03/2020 1214   AST 19 09/03/2020 1214   ALT 17 09/03/2020 1214   ALKPHOS 71 09/03/2020 1214   BILITOT 0.8 09/03/2020 1214   GFRNONAA >60 09/03/2020 1214    No results found for: CEA1, CEA / No results found for: CEA1, CEA No results found for: PSA1 No results found for: CAN199 No results found for: CAN125  No results found for: STEPHANY RINGS, A1GS, A2GS, BETS, BETA2SER, GAMS, MSPIKE, SPEI Lab Results  Component Value Date   TIBC 362 08/13/2023   FERRITIN 37 08/13/2023   IRONPCTSAT 24 08/13/2023   Lab  Results  Component Value Date   LDH 127 08/13/2023     STUDIES:   No results found.

## 2023-08-13 ENCOUNTER — Inpatient Hospital Stay: Admitting: Oncology

## 2023-08-13 ENCOUNTER — Inpatient Hospital Stay: Attending: Hematology | Admitting: Hematology

## 2023-08-13 VITALS — BP 135/61 | HR 62 | Temp 97.4°F | Resp 18 | Wt 120.2 lb

## 2023-08-13 DIAGNOSIS — D539 Nutritional anemia, unspecified: Secondary | ICD-10-CM | POA: Diagnosis not present

## 2023-08-13 DIAGNOSIS — R779 Abnormality of plasma protein, unspecified: Secondary | ICD-10-CM | POA: Diagnosis not present

## 2023-08-13 DIAGNOSIS — Z923 Personal history of irradiation: Secondary | ICD-10-CM | POA: Diagnosis not present

## 2023-08-13 DIAGNOSIS — Z85818 Personal history of malignant neoplasm of other sites of lip, oral cavity, and pharynx: Secondary | ICD-10-CM | POA: Diagnosis not present

## 2023-08-13 DIAGNOSIS — D61818 Other pancytopenia: Secondary | ICD-10-CM

## 2023-08-13 DIAGNOSIS — Z79899 Other long term (current) drug therapy: Secondary | ICD-10-CM | POA: Diagnosis not present

## 2023-08-13 DIAGNOSIS — Z9221 Personal history of antineoplastic chemotherapy: Secondary | ICD-10-CM | POA: Insufficient documentation

## 2023-08-13 DIAGNOSIS — E7800 Pure hypercholesterolemia, unspecified: Secondary | ICD-10-CM | POA: Diagnosis not present

## 2023-08-13 DIAGNOSIS — Z8589 Personal history of malignant neoplasm of other organs and systems: Secondary | ICD-10-CM | POA: Diagnosis not present

## 2023-08-13 DIAGNOSIS — D469 Myelodysplastic syndrome, unspecified: Secondary | ICD-10-CM | POA: Insufficient documentation

## 2023-08-13 DIAGNOSIS — G4700 Insomnia, unspecified: Secondary | ICD-10-CM | POA: Insufficient documentation

## 2023-08-13 DIAGNOSIS — I10 Essential (primary) hypertension: Secondary | ICD-10-CM | POA: Diagnosis not present

## 2023-08-13 LAB — CBC WITH DIFFERENTIAL/PLATELET
Abs Immature Granulocytes: 0.01 10*3/uL (ref 0.00–0.07)
Basophils Absolute: 0 10*3/uL (ref 0.0–0.1)
Basophils Relative: 0 %
Eosinophils Absolute: 0.3 10*3/uL (ref 0.0–0.5)
Eosinophils Relative: 5 %
HCT: 25.7 % — ABNORMAL LOW (ref 39.0–52.0)
Hemoglobin: 8.7 g/dL — ABNORMAL LOW (ref 13.0–17.0)
Immature Granulocytes: 0 %
Lymphocytes Relative: 31 %
Lymphs Abs: 1.6 10*3/uL (ref 0.7–4.0)
MCH: 36.4 pg — ABNORMAL HIGH (ref 26.0–34.0)
MCHC: 33.9 g/dL (ref 30.0–36.0)
MCV: 107.5 fL — ABNORMAL HIGH (ref 80.0–100.0)
Monocytes Absolute: 0.5 10*3/uL (ref 0.1–1.0)
Monocytes Relative: 9 %
Neutro Abs: 2.8 10*3/uL (ref 1.7–7.7)
Neutrophils Relative %: 55 %
Platelets: 9 10*3/uL — CL (ref 150–400)
RBC: 2.39 MIL/uL — ABNORMAL LOW (ref 4.22–5.81)
RDW: 15.3 % (ref 11.5–15.5)
WBC: 5.1 10*3/uL (ref 4.0–10.5)
nRBC: 0 % (ref 0.0–0.2)

## 2023-08-13 LAB — LACTATE DEHYDROGENASE: LDH: 127 U/L (ref 98–192)

## 2023-08-13 LAB — RETICULOCYTES
Immature Retic Fract: 26 % — ABNORMAL HIGH (ref 2.3–15.9)
RBC.: 2.44 MIL/uL — ABNORMAL LOW (ref 4.22–5.81)
Retic Count, Absolute: 54.2 10*3/uL (ref 19.0–186.0)
Retic Ct Pct: 2.2 % (ref 0.4–3.1)

## 2023-08-13 LAB — IRON AND TIBC
Iron: 86 ug/dL (ref 45–182)
Saturation Ratios: 24 % (ref 17.9–39.5)
TIBC: 362 ug/dL (ref 250–450)
UIBC: 276 ug/dL

## 2023-08-13 LAB — FOLATE: Folate: 13.1 ng/mL (ref >5.9)

## 2023-08-13 LAB — VITAMIN B12: Vitamin B-12: 413 pg/mL (ref 180–914)

## 2023-08-13 LAB — SAMPLE TO BLOOD BANK

## 2023-08-13 LAB — FERRITIN: Ferritin: 37 ng/mL (ref 24–336)

## 2023-08-13 LAB — ABO/RH: ABO/RH(D): AB POS

## 2023-08-13 NOTE — Progress Notes (Signed)
 CRITICAL VALUE ALERT Critical value received:  Platelets 9,000.  Date of notification:  08-13-2023 Time of notification: 15:03 pm.  Critical value read back:  Yes.   Nurse who received alert:  B.Bayle Calvo RN.  MD notified time and response:  Dr. Katragadda @ 15:04 pm.

## 2023-08-13 NOTE — Patient Instructions (Signed)
 You were seen and examined today by Dr. Rogers. Dr. Rogers is a hematologist, meaning that he specializes in blood abnormalities. Dr. Rogers discussed your past medical history, family history of cancers/blood conditions and the events that led to you being here today.  You were referred to Dr. Rogers due to pancytopenia (low blood counts).  Dr. Katragadda has recommended additional labs today for further evaluation.  Follow-up as scheduled.

## 2023-08-13 NOTE — Progress Notes (Signed)
 1 unit of platelets 08-14-2023 at 08:30 am. Blood bank and patient aware. Scheduling aware.

## 2023-08-14 ENCOUNTER — Other Ambulatory Visit: Payer: Self-pay | Admitting: *Deleted

## 2023-08-14 ENCOUNTER — Inpatient Hospital Stay

## 2023-08-14 DIAGNOSIS — Z923 Personal history of irradiation: Secondary | ICD-10-CM | POA: Diagnosis not present

## 2023-08-14 DIAGNOSIS — E7800 Pure hypercholesterolemia, unspecified: Secondary | ICD-10-CM | POA: Diagnosis not present

## 2023-08-14 DIAGNOSIS — Z9221 Personal history of antineoplastic chemotherapy: Secondary | ICD-10-CM | POA: Diagnosis not present

## 2023-08-14 DIAGNOSIS — R779 Abnormality of plasma protein, unspecified: Secondary | ICD-10-CM | POA: Diagnosis not present

## 2023-08-14 DIAGNOSIS — D539 Nutritional anemia, unspecified: Secondary | ICD-10-CM | POA: Diagnosis not present

## 2023-08-14 DIAGNOSIS — I10 Essential (primary) hypertension: Secondary | ICD-10-CM | POA: Diagnosis not present

## 2023-08-14 DIAGNOSIS — Z79899 Other long term (current) drug therapy: Secondary | ICD-10-CM | POA: Diagnosis not present

## 2023-08-14 DIAGNOSIS — Z85818 Personal history of malignant neoplasm of other sites of lip, oral cavity, and pharynx: Secondary | ICD-10-CM | POA: Diagnosis not present

## 2023-08-14 DIAGNOSIS — Z8589 Personal history of malignant neoplasm of other organs and systems: Secondary | ICD-10-CM | POA: Diagnosis not present

## 2023-08-14 DIAGNOSIS — D61818 Other pancytopenia: Secondary | ICD-10-CM

## 2023-08-14 DIAGNOSIS — D469 Myelodysplastic syndrome, unspecified: Secondary | ICD-10-CM | POA: Diagnosis not present

## 2023-08-14 DIAGNOSIS — G4700 Insomnia, unspecified: Secondary | ICD-10-CM | POA: Diagnosis not present

## 2023-08-14 LAB — KAPPA/LAMBDA LIGHT CHAINS
Kappa free light chain: 42.9 mg/L — ABNORMAL HIGH (ref 3.3–19.4)
Kappa, lambda light chain ratio: 1.31 (ref 0.26–1.65)
Lambda free light chains: 32.7 mg/L — ABNORMAL HIGH (ref 5.7–26.3)

## 2023-08-14 LAB — COPPER, SERUM: Copper: 118 ug/dL (ref 69–132)

## 2023-08-14 MED ORDER — SODIUM CHLORIDE 0.9% IV SOLUTION
250.0000 mL | INTRAVENOUS | Status: DC
  Administered 2023-08-14: 100 mL via INTRAVENOUS

## 2023-08-14 MED ORDER — DIPHENHYDRAMINE HCL 25 MG PO CAPS
25.0000 mg | ORAL_CAPSULE | Freq: Once | ORAL | Status: AC
  Administered 2023-08-14: 25 mg via ORAL
  Filled 2023-08-14: qty 1

## 2023-08-14 MED ORDER — ACETAMINOPHEN 325 MG PO TABS
650.0000 mg | ORAL_TABLET | Freq: Once | ORAL | Status: AC
  Administered 2023-08-14: 650 mg via ORAL
  Filled 2023-08-14: qty 2

## 2023-08-14 NOTE — Progress Notes (Signed)
 Patient presents today for 1 unit of Platelets per physician order.   1 unit of platelets given today per MD orders. Tolerated infusion without adverse affects. Vital signs stable. No complaints at this time. Discharged from clinic ambulatory in stable condition. Alert and oriented x 3. F/U with [facility] as scheduled.

## 2023-08-14 NOTE — Progress Notes (Addendum)
   08/14/23 0908  Spiritual Encounters  Type of Visit Initial  Care provided to: Pt and family  Referral source Other (comment) (Chaplain making rounds)  Reason for visit  (Introduction to Spiritual Care services)  OnCall Visit No  Spiritual Framework  Presenting Themes Community and relationships  Interventions  Spiritual Care Interventions Made Established relationship of care and support;Other (comment) (Provided eduction on chaplain role & availability)  Intervention Outcomes  Outcomes Awareness around self/spiritual resourses  Spiritual Care Plan  Spiritual Care Issues Still Outstanding Chaplain will continue to follow  Advance Directives (For Healthcare)  Does Patient Have a Medical Advance Directive? No  Would patient like information on creating a medical advance directive? No - Patient declined  Mental Health Advance Directives  Does Patient Have a Mental Health Advance Directive? No  Would patient like information on creating a mental health advance directive? No - Patient declined   Chaplain was making rounds o the floor and introducing myself to the Pt's as the new chaplain for the center.  I explained the role of the chaplain and our availability to the Pts and their caregivers as an extra layer of support.  Rns came in to provide care so I dismissed myself with a promise to return later for conversation.  Kenneth was open this this.  Maude Roll MDiv Chaplain, [facility]

## 2023-08-14 NOTE — Patient Instructions (Signed)
 [facility] [place] - A DEPT OF [place]  Discharge Instructions: Thank you for choosing [facility] to provide your oncology and hematology care.  If you have a lab appointment with the Cancer Center - please note that after April 8th, 2024, all labs will be drawn in the cancer center.  You do not have to check in or register with the main entrance as you have in the past but will complete your check-in in the cancer center.  Wear comfortable clothing and clothing appropriate for easy access to any Portacath or PICC line.   We strive to give you quality time with your provider. You may need to reschedule your appointment if you arrive late (15 or more minutes).  Arriving late affects you and other patients whose appointments are after yours.  Also, if you miss three or more appointments without notifying the office, you may be dismissed from the clinic at the provider's discretion.      For prescription refill requests, have your pharmacy contact our office and allow 72 hours for refills to be completed.    Today you received the following chemotherapy and/or immunotherapy agents 2 units of blood products. Blood Transfusion, Adult, Care After The following information offers guidance on how to care for yourself after your procedure. Your health care provider may also give you more specific instructions. If you have problems or questions, contact your health care provider. What can I expect after the procedure? After the procedure, it is common to have: Bruising and soreness where the IV was inserted. A headache. Follow these instructions at home: IV insertion site care     Follow instructions from your health care provider about how to take care of your IV insertion site. Make sure you: Wash your hands with soap and water for at least 20 seconds before and after you change your bandage (dressing). If soap and water are not available, use hand  sanitizer. Change your dressing as told by your health care provider. Check your IV insertion site every day for signs of infection. Check for: Redness, swelling, or pain. Bleeding from the site. Warmth. Pus or a bad smell. General instructions Take over-the-counter and prescription medicines only as told by your health care provider. Rest as told by your health care provider. Return to your normal activities as told by your health care provider. Keep all follow-up visits. Lab tests may need to be done at certain periods to recheck your blood counts. Contact a health care provider if: You have itching or red, swollen areas of skin (hives). You have a fever or chills. You have pain in the head, back, or chest. You feel anxious or you feel weak after doing your normal activities. You have redness, swelling, warmth, or pain around the IV insertion site. You have blood coming from the IV insertion site that does not stop with pressure. You have pus or a bad smell coming from your IV insertion site. If you received your blood transfusion in an outpatient setting, you will be told whom to contact to report any reactions. Get help right away if: You have symptoms of a serious allergic or immune system reaction, including: Trouble breathing or shortness of breath. Swelling of the face, feeling flushed, or widespread rash. Dark urine or blood in the urine. Fast heartbeat. These symptoms may be an emergency. Get help right away. Call 911. Do not wait to see if the symptoms will go away. Do  not drive yourself to the hospital. Summary Bruising and soreness around the IV insertion site are common. Check your IV insertion site every day for signs of infection. Rest as told by your health care provider. Return to your normal activities as told by your health care provider. Get help right away for symptoms of a serious allergic or immune system reaction to the blood transfusion. This information  is not intended to replace advice given to you by your health care provider. Make sure you discuss any questions you have with your health care provider. Document Revised: 04/27/2021 Document Reviewed: 04/27/2021 Elsevier Patient Education  2024 Elsevier Inc.      To help prevent nausea and vomiting after your treatment, we encourage you to take your nausea medication as directed.  BELOW ARE SYMPTOMS THAT SHOULD BE REPORTED IMMEDIATELY: *FEVER GREATER THAN 100.4 F (38 C) OR HIGHER *CHILLS OR SWEATING *NAUSEA AND VOMITING THAT IS NOT CONTROLLED WITH YOUR NAUSEA MEDICATION *UNUSUAL SHORTNESS OF BREATH *UNUSUAL BRUISING OR BLEEDING *URINARY PROBLEMS (pain or burning when urinating, or frequent urination) *BOWEL PROBLEMS (unusual diarrhea, constipation, pain near the anus) TENDERNESS IN MOUTH AND THROAT WITH OR WITHOUT PRESENCE OF ULCERS (sore throat, sores in mouth, or a toothache) UNUSUAL RASH, SWELLING OR PAIN  UNUSUAL VAGINAL DISCHARGE OR ITCHING   Items with * indicate a potential emergency and should be followed up as soon as possible or go to the Emergency Department if any problems should occur.  Please show the CHEMOTHERAPY ALERT CARD or IMMUNOTHERAPY ALERT CARD at check-in to the Emergency Department and triage nurse.  Should you have questions after your visit or need to cancel or reschedule your appointment, please contact [facility] [place] - A DEPT OF [facility] [phone]  and follow the prompts.  Office hours are 8:00 a.m. to 4:30 p.m. Monday - Friday. Please note that voicemails left after 4:00 p.m. may not be returned until the following business day.  We are closed weekends and major holidays. You have access to a nurse at all times for urgent questions. Please call the main number to the clinic [phone] and follow the prompts.  For any non-urgent questions, you may also contact your provider using MyChart. We now offer e-Visits for anyone 28  and older to request care online for non-urgent symptoms. For details visit [email or web address].   Also download the MyChart app! Go to the app store, search MyChart, open the app, select [place], and log in with your MyChart username and password.

## 2023-08-15 LAB — BPAM PLATELET PHERESIS
Blood Product Expiration Date: 202507032359
ISSUE DATE / TIME: 202507030938
Unit Type and Rh: 5100

## 2023-08-15 LAB — PROTEIN ELECTROPHORESIS, SERUM
A/G Ratio: 1.1 (ref 0.7–1.7)
Albumin ELP: 3.8 g/dL (ref 2.9–4.4)
Alpha-1-Globulin: 0.3 g/dL (ref 0.0–0.4)
Alpha-2-Globulin: 0.7 g/dL (ref 0.4–1.0)
Beta Globulin: 1.2 g/dL (ref 0.7–1.3)
Gamma Globulin: 1.4 g/dL (ref 0.4–1.8)
Globulin, Total: 3.6 g/dL (ref 2.2–3.9)
Total Protein ELP: 7.4 g/dL (ref 6.0–8.5)

## 2023-08-15 LAB — PREPARE PLATELET PHERESIS: Unit division: 0

## 2023-08-17 LAB — IMMUNOFIXATION ELECTROPHORESIS
IgA: 267 mg/dL (ref 61–437)
IgG (Immunoglobin G), Serum: 1512 mg/dL (ref 603–1613)
IgM (Immunoglobulin M), Srm: 79 mg/dL (ref 15–143)
Total Protein ELP: 7.5 g/dL (ref 6.0–8.5)

## 2023-08-18 LAB — SURGICAL PATHOLOGY

## 2023-08-18 LAB — METHYLMALONIC ACID, SERUM: Methylmalonic Acid, Quantitative: 206 nmol/L (ref 0–378)

## 2023-08-18 MED ORDER — ALPRAZOLAM 0.5 MG PO TABS: 0.5000 mg | ORAL_TABLET | Freq: Once | ORAL | 0 refills | Status: AC

## 2023-08-19 ENCOUNTER — Inpatient Hospital Stay (HOSPITAL_BASED_OUTPATIENT_CLINIC_OR_DEPARTMENT_OTHER): Admitting: Hematology

## 2023-08-19 ENCOUNTER — Inpatient Hospital Stay

## 2023-08-19 VITALS — BP 142/69 | HR 68 | Temp 97.1°F | Resp 16

## 2023-08-19 DIAGNOSIS — D61818 Other pancytopenia: Secondary | ICD-10-CM

## 2023-08-19 DIAGNOSIS — Z85818 Personal history of malignant neoplasm of other sites of lip, oral cavity, and pharynx: Secondary | ICD-10-CM | POA: Diagnosis not present

## 2023-08-19 DIAGNOSIS — D649 Anemia, unspecified: Secondary | ICD-10-CM | POA: Diagnosis not present

## 2023-08-19 DIAGNOSIS — E7800 Pure hypercholesterolemia, unspecified: Secondary | ICD-10-CM | POA: Diagnosis not present

## 2023-08-19 DIAGNOSIS — D7589 Other specified diseases of blood and blood-forming organs: Secondary | ICD-10-CM | POA: Diagnosis not present

## 2023-08-19 DIAGNOSIS — Z79899 Other long term (current) drug therapy: Secondary | ICD-10-CM | POA: Diagnosis not present

## 2023-08-19 DIAGNOSIS — Z923 Personal history of irradiation: Secondary | ICD-10-CM | POA: Diagnosis not present

## 2023-08-19 DIAGNOSIS — R779 Abnormality of plasma protein, unspecified: Secondary | ICD-10-CM | POA: Diagnosis not present

## 2023-08-19 DIAGNOSIS — D709 Neutropenia, unspecified: Secondary | ICD-10-CM | POA: Diagnosis not present

## 2023-08-19 DIAGNOSIS — Z9221 Personal history of antineoplastic chemotherapy: Secondary | ICD-10-CM | POA: Diagnosis not present

## 2023-08-19 DIAGNOSIS — D539 Nutritional anemia, unspecified: Secondary | ICD-10-CM | POA: Diagnosis not present

## 2023-08-19 DIAGNOSIS — G4700 Insomnia, unspecified: Secondary | ICD-10-CM | POA: Diagnosis not present

## 2023-08-19 DIAGNOSIS — I10 Essential (primary) hypertension: Secondary | ICD-10-CM | POA: Diagnosis not present

## 2023-08-19 DIAGNOSIS — Z8589 Personal history of malignant neoplasm of other organs and systems: Secondary | ICD-10-CM | POA: Diagnosis not present

## 2023-08-19 DIAGNOSIS — D469 Myelodysplastic syndrome, unspecified: Secondary | ICD-10-CM | POA: Diagnosis not present

## 2023-08-19 LAB — CBC WITH DIFFERENTIAL/PLATELET
Abs Immature Granulocytes: 0.02 K/uL (ref 0.00–0.07)
Basophils Absolute: 0 K/uL (ref 0.0–0.1)
Basophils Relative: 0 %
Eosinophils Absolute: 0.2 K/uL (ref 0.0–0.5)
Eosinophils Relative: 5 %
HCT: 23.2 % — ABNORMAL LOW (ref 39.0–52.0)
Hemoglobin: 7.5 g/dL — ABNORMAL LOW (ref 13.0–17.0)
Immature Granulocytes: 1 %
Lymphocytes Relative: 36 %
Lymphs Abs: 1 K/uL (ref 0.7–4.0)
MCH: 34.7 pg — ABNORMAL HIGH (ref 26.0–34.0)
MCHC: 32.3 g/dL (ref 30.0–36.0)
MCV: 107.4 fL — ABNORMAL HIGH (ref 80.0–100.0)
Monocytes Absolute: 0.3 K/uL (ref 0.1–1.0)
Monocytes Relative: 12 %
Neutro Abs: 1.3 K/uL — ABNORMAL LOW (ref 1.7–7.7)
Neutrophils Relative %: 46 %
Platelets: 26 K/uL — CL (ref 150–400)
RBC: 2.16 MIL/uL — ABNORMAL LOW (ref 4.22–5.81)
RDW: 15.2 % (ref 11.5–15.5)
Smear Review: DECREASED
WBC: 2.8 K/uL — ABNORMAL LOW (ref 4.0–10.5)
nRBC: 0 % (ref 0.0–0.2)

## 2023-08-19 LAB — SAMPLE TO BLOOD BANK

## 2023-08-19 LAB — PREPARE RBC (CROSSMATCH)

## 2023-08-19 NOTE — Progress Notes (Signed)
 [facility] [address] [place] [address]    Clinic Day:  08/19/2023  Referring physician: Rosamond Leta NOVAK, MD  Patient Care Team: Rosamond Leta NOVAK, MD as PCP - General (Internal Medicine)   ASSESSMENT & PLAN:   Assessment: 1.  Severe pancytopenia: - Patient seen at the request of Dr. Rosamond - Ian Ryan had a history of thrombocytopenia for several years with platelet count ranging between 40-65 since 2022. - Recent CBC on 08/05/2023: WBC-3.2, ANC-1.3, (39% N, 42% L, 11% M), Hb-8.4, PLT-8, MCV-108. - 12/01/2021: WBC-4.8, Hb-12.4, PLT-65, MCV-97, ANC-2.2 - 11/22/2020: WBC-4.7, Hb-13.1, PLT-49 - 09/03/2020: WBC-5.3, Hb-13.2, PLT-41 - No prior history of transfusions.  No recent infections.  No new medications.  Denies any bleeding or bruising.  Ian Ryan lost about 11 pounds in the last 6 months.  Ian Ryan reportedly had a bone marrow biopsy in 2022 in [place] but reports are not available.  Ian Ryan reports taking B12 supplements in the past until a year ago.   2.  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 - PET scan (04/03/2022): Left pyriform fossa and AE fold mass lesion infiltrating epiglottis.  Left level 2 and 3 lymph nodes present. - Underwent chemoradiation therapy with Nimotuzumab (EGFR antibody) and carboplatin. - Subsequent PET scan in July 2024 and March 2025 did not show any active disease.   3.  Social/family history: - Ian Ryan was seen with his son today.  Ian Ryan is a non-smoker. - No family history of cancer or leukemia.  Plan:  1.  Severe thrombocytopenia/macrocytic anemia: - I reviewed labs from 08/13/2023: Nutritional deficiency workup shows normal B12, copper  and MMA.  Ferritin was 37.  LDH was normal.  SPEP did not show M spike.  Immunofixation was unremarkable.  Kappa and lambda chains were elevated with normal ratio.  Reticulocyte count is 2.2%. - Flow cytometry did not show any abnormal B or T-cell population. - NGS myeloid panel:  Pending. - Ian Ryan did receive platelet transfusion last week for platelet count of 9000. - CBC today shows white count is 2.8 with ANC of 1.3.  Monocytes were 12%.  5% eosinophils.  Hemoglobin is 7.5.  Platelet count is 26.  Ian Ryan will receive 1 unit PRBC. - We talked about bone marrow aspiration and biopsy as further workup for pancytopenia.  We discussed the procedure in detail.  Ian Ryan is agreeable.  Will proceed with bone marrow biopsy today.  Ian Ryan will come back in 2 weeks for follow-up.   Orders Placed This Encounter  Procedures   CBC with Differential/Platelet    Standing Status:   Future    Number of Occurrences:   1    Expiration Date:   08/18/2024    Release to patient:   Immediate   Care order/instruction    Transfuse Parameters    Standing Status:   Future    Number of Occurrences:   1    Expiration Date:   08/18/2024   Sample to Blood Bank(Blood Bank Hold)    Standing Status:   Future    Number of Occurrences:   1    Expiration Date:   08/18/2024   Type and screen         Standing Status:   Future    Number of Occurrences:   1    Expiration Date:   08/18/2024   Prepare RBC (crossmatch)    Standing Status:   Standing    Number of  Occurrences:   1    # of Units:   1 unit    Transfusion Indications:   Other    Comments:   per provider    Number of Units to Keep Ahead:   NO units ahead    If emergent release call blood bank:   Not emergent release       [name], MD   7/8/20253:41 PM  CHIEF COMPLAINT:   Diagnosis: Severe thrombocytopenia/microcytic anemia  Cancer Staging  No matching staging information was found for the patient.    Prior Therapy: None  Current Therapy: Under workup   HISTORY OF PRESENT ILLNESS:   Oncology History   No history exists.     INTERVAL HISTORY:   Ian Ryan is a 78 y.o. male presenting to clinic today for follow up of pancytopenia. Ian Ryan was last seen by me on 08/13/2023.  Ian Ryan is accompanied by his son.  Today, Ian Ryan states that Ian Ryan  is doing well overall. His appetite level is at 70%. His energy level is at 100%.  PAST MEDICAL HISTORY:   Past Medical History: Past Medical History:  Diagnosis Date   Hypertension     Surgical History: No past surgical history on file.  Social History: Social History   Socioeconomic History   Marital status: Married    Spouse name: Not on file   Number of children: Not on file   Years of education: Not on file   Highest education level: Not on file  Occupational History   Not on file  Tobacco Use   Smoking status: Never   Smokeless tobacco: Never  Substance and Sexual Activity   Alcohol  use: Not Currently   Drug use: Not Currently   Sexual activity: Not on file  Other Topics Concern   Not on file  Social History Narrative   Not on file   Social Drivers of Health   Financial Resource Strain: Not on file  Food Insecurity: Not on file  Transportation Needs: Not on file  Physical Activity: Not on file  Stress: Not on file  Social Connections: Not on file  Intimate Partner Violence: Not on file    Family History: No family history on file.  Current Medications:  Current Outpatient Medications:    losartan  (COZAAR ) 50 MG tablet, Take 1.5 tablets (75 mg total) by mouth daily., Disp: 45 tablet, Rfl: 0   pantoprazole (PROTONIX) 40 MG tablet, Take 40 mg by mouth daily., Disp: , Rfl:    PRESCRIPTION MEDICATION, Repay blood pressure med from [place], Disp: , Rfl:    traMADol -acetaminophen  (ULTRACET ) 37.5-325 MG tablet, Take 1-2 tablets at bedtime by mouth. May cause constipation., Disp: 10 tablet, Rfl: 0   Allergies: No Known Allergies  REVIEW OF SYSTEMS:   Review of Systems  HENT:   Positive for trouble swallowing.   Musculoskeletal:  Positive for back pain.  Psychiatric/Behavioral:  Positive for sleep disturbance.   All other systems reviewed and are negative.    VITALS:   Blood pressure (!) 142/69, pulse 68, temperature (!) 97.1 F (36.2 C), temperature  source Tympanic, resp. rate 16, SpO2 98%.  Wt Readings from Last 3 Encounters:  08/13/23 120 lb 2.4 oz (54.5 kg)    There is no height or weight on file to calculate BMI.  Performance status (ECOG): 1 - Symptomatic but completely ambulatory  PHYSICAL EXAM:   Physical Exam Vitals reviewed.  Constitutional:      Appearance: Normal appearance.  Neurological:     General:  No focal deficit present.     Mental Status: Ian Ryan is alert and oriented to person, place, and time.  Psychiatric:        Mood and Affect: Mood normal.        Behavior: Behavior normal.     LABS:   CBC     Component Value Date/Time   WBC 2.8 (L) 08/19/2023 0818   RBC 2.16 (L) 08/19/2023 0818   HGB 7.5 (L) 08/19/2023 0818   HCT 23.2 (L) 08/19/2023 0818   PLT 26 (LL) 08/19/2023 0818   MCV 107.4 (H) 08/19/2023 0818   MCH 34.7 (H) 08/19/2023 0818   MCHC 32.3 08/19/2023 0818   RDW 15.2 08/19/2023 0818   LYMPHSABS 1.0 08/19/2023 0818   MONOABS 0.3 08/19/2023 0818   EOSABS 0.2 08/19/2023 0818   BASOSABS 0.0 08/19/2023 0818    CMP      Component Value Date/Time   NA 135 09/03/2020 1214   K 4.9 09/03/2020 1214   CL 103 09/03/2020 1214   CO2 26 09/03/2020 1214   GLUCOSE 103 (H) 09/03/2020 1214   BUN 9 09/03/2020 1214   CREATININE 1.06 09/03/2020 1214   CALCIUM 10.0 09/03/2020 1214   PROT 7.4 09/03/2020 1214   ALBUMIN 3.8 09/03/2020 1214   AST 19 09/03/2020 1214   ALT 17 09/03/2020 1214   ALKPHOS 71 09/03/2020 1214   BILITOT 0.8 09/03/2020 1214   GFRNONAA >60 09/03/2020 1214     No results found for: CEA1, CEA / No results found for: CEA1, CEA No results found for: PSA1 No results found for: CAN199 No results found for: RJW874  Lab Results  Component Value Date   TOTALPROTELP 7.4 08/13/2023   TOTALPROTELP 7.5 08/13/2023   ALBUMINELP 3.8 08/13/2023   A1GS 0.3 08/13/2023   A2GS 0.7 08/13/2023   BETS 1.2 08/13/2023   GAMS 1.4 08/13/2023   MSPIKE Not Observed 08/13/2023   SPEI  Comment 08/13/2023   Lab Results  Component Value Date   TIBC 362 08/13/2023   FERRITIN 37 08/13/2023   IRONPCTSAT 24 08/13/2023   Lab Results  Component Value Date   LDH 127 08/13/2023     STUDIES:   No results found.

## 2023-08-19 NOTE — Progress Notes (Signed)
 INDICATION: Pancytopenia   Bone Marrow Biopsy and Aspiration Procedure Note   The patient was identified by name and date of birth, prior to start of the procedure and a timeout was performed.   An informed consent was obtained after discussing potential risks including bleeding, infection and pain.  The right posterior iliac crest was palpated, cleaned with ChloraPrep, and drapes applied.  1% lidocaine is infiltrated into the skin, subcutaneous tissue and periosteum.  Bone marrow was aspirated and smears made.  With the help of Jamshidi needle a core biopsy was obtained.  Pressure was applied to the biopsy site and bandage was placed over the biopsy site. Patient was made to lie on the back for 15 mins prior to discharge.  The procedure was tolerated well. COMPLICATIONS: None BLOOD LOSS: none Patient was discharged home in stable condition to return in 2 weeks to review results.  Patient was provided with post bone marrow biopsy instructions and instructed to call if there was any bleeding or worsening pain.  Specimens sent for flow cytometry, cytogenetics and additional studies.  Signed [name], MD

## 2023-08-19 NOTE — Progress Notes (Signed)
 Patient here today for bone marrow biopsy. Procedure explained and consent signed by all parties at 07:44 AM. Patient placed in prone position with both arms above head 07:46 AM. Time out conducted at 07:44 AM ,patient and team agreed. Procedure started at 07:53 AM. Patient tolerated procedure well with minimal pain and discomfort. Specimens collected and labeled appropriately. Procedure completed at 08:15 AM. Dressing applied and patient reposition on back, head of bed elevated, resting at 08:16 AM. Specimens taken to lab for processing. Patient remained stable during procedure and discharged home ambulatory in stable condition with family member at 08:34 AM.

## 2023-08-19 NOTE — Progress Notes (Signed)
 CRITICAL VALUE ALERT Critical value received:  platelets 26 Date of notification:  08-19-23 Time of notification: 0904 Critical value read back:  Yes.   Nurse who received alert:  Creola Bruch RN MD notified time and response:  Dr. Katragadda, [phone], will give one unit of PRBC tomorrow for hemoglobin of 7.5, no platelets.   Spoke with son and is aware of appt for tomorrow to get blood

## 2023-08-20 ENCOUNTER — Inpatient Hospital Stay

## 2023-08-20 DIAGNOSIS — R779 Abnormality of plasma protein, unspecified: Secondary | ICD-10-CM | POA: Diagnosis not present

## 2023-08-20 DIAGNOSIS — D61818 Other pancytopenia: Secondary | ICD-10-CM

## 2023-08-20 DIAGNOSIS — Z9221 Personal history of antineoplastic chemotherapy: Secondary | ICD-10-CM | POA: Diagnosis not present

## 2023-08-20 DIAGNOSIS — D469 Myelodysplastic syndrome, unspecified: Secondary | ICD-10-CM | POA: Diagnosis not present

## 2023-08-20 DIAGNOSIS — D539 Nutritional anemia, unspecified: Secondary | ICD-10-CM | POA: Diagnosis not present

## 2023-08-20 DIAGNOSIS — G4700 Insomnia, unspecified: Secondary | ICD-10-CM | POA: Diagnosis not present

## 2023-08-20 DIAGNOSIS — I10 Essential (primary) hypertension: Secondary | ICD-10-CM | POA: Diagnosis not present

## 2023-08-20 DIAGNOSIS — Z85818 Personal history of malignant neoplasm of other sites of lip, oral cavity, and pharynx: Secondary | ICD-10-CM | POA: Diagnosis not present

## 2023-08-20 DIAGNOSIS — Z923 Personal history of irradiation: Secondary | ICD-10-CM | POA: Diagnosis not present

## 2023-08-20 DIAGNOSIS — Z8589 Personal history of malignant neoplasm of other organs and systems: Secondary | ICD-10-CM | POA: Diagnosis not present

## 2023-08-20 DIAGNOSIS — Z79899 Other long term (current) drug therapy: Secondary | ICD-10-CM | POA: Diagnosis not present

## 2023-08-20 DIAGNOSIS — E7800 Pure hypercholesterolemia, unspecified: Secondary | ICD-10-CM | POA: Diagnosis not present

## 2023-08-20 MED ORDER — ACETAMINOPHEN 325 MG PO TABS
650.0000 mg | ORAL_TABLET | Freq: Once | ORAL | Status: AC
  Administered 2023-08-20: 650 mg via ORAL
  Filled 2023-08-20: qty 2

## 2023-08-20 MED ORDER — DIPHENHYDRAMINE HCL 25 MG PO CAPS
25.0000 mg | ORAL_CAPSULE | Freq: Once | ORAL | Status: AC
  Administered 2023-08-20: 25 mg via ORAL
  Filled 2023-08-20: qty 1

## 2023-08-20 MED ORDER — SODIUM CHLORIDE 0.9% IV SOLUTION
250.0000 mL | INTRAVENOUS | Status: DC
  Administered 2023-08-20: 250 mL via INTRAVENOUS

## 2023-08-20 NOTE — Patient Instructions (Signed)
 [facility] - A DEPT OF [facility]  Discharge Instructions: Thank you for choosing [facility] to provide your oncology and hematology care.  If you have a lab appointment with the Cancer Center - please note that after April 8th, 2024, all labs will be drawn in the cancer center.  You do not have to check in or register with the main entrance as you have in the past but will complete your check-in in the cancer center.  Wear comfortable clothing and clothing appropriate for easy access to any Portacath or PICC line.   We strive to give you quality time with your provider. You may need to reschedule your appointment if you arrive late (15 or more minutes).  Arriving late affects you and other patients whose appointments are after yours.  Also, if you miss three or more appointments without notifying the office, you may be dismissed from the clinic at the provider's discretion.      For prescription refill requests, have your pharmacy contact our office and allow 72 hours for refills to be completed.    Today you received 1 unit of blood.   BELOW ARE SYMPTOMS THAT SHOULD BE REPORTED IMMEDIATELY: *FEVER GREATER THAN 100.4 F (38 C) OR HIGHER *CHILLS OR SWEATING *NAUSEA AND VOMITING THAT IS NOT CONTROLLED WITH YOUR NAUSEA MEDICATION *UNUSUAL SHORTNESS OF BREATH *UNUSUAL BRUISING OR BLEEDING *URINARY PROBLEMS (pain or burning when urinating, or frequent urination) *BOWEL PROBLEMS (unusual diarrhea, constipation, pain near the anus) TENDERNESS IN MOUTH AND THROAT WITH OR WITHOUT PRESENCE OF ULCERS (sore throat, sores in mouth, or a toothache) UNUSUAL RASH, SWELLING OR PAIN  UNUSUAL VAGINAL DISCHARGE OR ITCHING   Items with * indicate a potential emergency and should be followed up as soon as possible or go to the Emergency Department if any problems should occur.  Please show the CHEMOTHERAPY ALERT CARD or IMMUNOTHERAPY ALERT CARD at check-in to  the Emergency Department and triage nurse.  Should you have questions after your visit or need to cancel or reschedule your appointment, please contact [facility] [place] [facility] - A DEPT OF [facility] [phone]  and follow the prompts.  Office hours are 8:00 a.m. to 4:30 p.m. Monday - Friday. Please note that voicemails left after 4:00 p.m. may not be returned until the following business day.  We are closed weekends and major holidays. You have access to a nurse at all times for urgent questions. Please call the main number to the clinic [phone] and follow the prompts.  For any non-urgent questions, you may also contact your provider using MyChart. We now offer e-Visits for anyone 25 and older to request care online for non-urgent symptoms. For details visit [email or web address].   Also download the MyChart app! Go to the app store, search "MyChart", open the app, select [place], and log in with your MyChart username and password.

## 2023-08-20 NOTE — Progress Notes (Signed)
 Patient presents today for 1 unit of blood per provider's orders. Vital signs stable and patient voiced no new complaints at this time.   Peripheral IV started with good blood return pre and post infusion.  Discharged from clinic ambulatory in stable condition. Alert and oriented x 3. F/U with [facility] as scheduled.

## 2023-08-20 NOTE — Progress Notes (Signed)
   08/20/23 1600  Spiritual Encounters  Type of Visit Follow up  Care provided to: Pt and family  Referral source Other (comment) (Follow up from previous encounter)  Reason for visit Routine spiritual support  OnCall Visit No  Spiritual Framework  Presenting Themes Caregiving needs;Coping tools;Community and relationships  Community/Connection Family  Patient Stress Factors None identified  Family Stress Factors None identified  Interventions  Spiritual Care Interventions Made Compassionate presence;Explored values/beliefs/practices/strengths;Encouragement  Intervention Outcomes  Outcomes Reduced isolation;Reduced anxiety;Connection to spiritual care  Spiritual Care Plan  Spiritual Care Issues Still Outstanding Chaplain will continue to follow   Reason for Visit: Chaplain following up with Pt after initial visit   Description of Visit: Arriving in the room I found Ian Ryan in a recliner chair receiving chemo treatment, with his son Ian Ryan present.   I worked to strengthen my connection with [employer] and hospitable behavior.  The language gap between Ian Ryan and I is wider than I originally thought and in future visits I may need to employ interpretive services.  For now, however, kindness and a warm smile are hopefully communicating my compassion for him.   In addition, I offered the hospitality of a cold drink for Ian Ryan and he accepted- he likes the lemon-lime/Sprite flavor.   In exploring caregiving needs with Cham's son Ian Ryan I discovered that Ian Ryan is the sole caregiver.  I will continue to connect with Ian Ryan in the future and watch carefully how he is doing throughout this journey.   Plan of Care: I will continue to connect with Ian Ryan in future visits   Maude Roll, MDiv   Chaplain, [facility]  [email or web address] [phone]

## 2023-08-21 LAB — TYPE AND SCREEN
ABO/RH(D): AB POS
Antibody Screen: NEGATIVE
Unit division: 0

## 2023-08-21 LAB — FLOW CYTOMETRY

## 2023-08-21 LAB — BPAM RBC
Blood Product Expiration Date: 202508052359
ISSUE DATE / TIME: 202507090934
Unit Type and Rh: 202508052359
Unit Type and Rh: 6200

## 2023-08-22 LAB — SURGICAL PATHOLOGY

## 2023-08-26 NOTE — Progress Notes (Signed)
 [facility] [address] [place] [address]    Clinic Day:  08/27/2023  Referring physician: Rosamond Leta NOVAK, MD  Patient Care Team: Rosamond Leta NOVAK, MD as PCP - General (Internal Medicine)   ASSESSMENT & PLAN:   Assessment: 1.  Severe pancytopenia: - Patient seen at the request of Dr. Rosamond - He had a history of thrombocytopenia for several years with platelet count ranging between 40-65 since 2022. - Recent CBC on 08/05/2023: WBC-3.2, ANC-1.3, (39% N, 42% L, 11% M), Hb-8.4, PLT-8, MCV-108. - 12/01/2021: WBC-4.8, Hb-12.4, PLT-65, MCV-97, ANC-2.2 - 11/22/2020: WBC-4.7, Hb-13.1, PLT-49 - 09/03/2020: WBC-5.3, Hb-13.2, PLT-41 - No prior history of transfusions.  No recent infections.  No new medications.  Denies any bleeding or bruising.  He lost about 11 pounds in the last 6 months.  He reportedly had a bone marrow biopsy in 2022 in [place] but reports are not available.  He reports taking B12 supplements in the past until a year ago. 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  Mild dyspoiesis in all lineages.  However it does not reach the threshold of 10% for dysplasia.  Differential includes evolving MDS.  Erythroid precursors are relatively increased, show full sequence of maturation with megaloblastoid change.  Granulocyte precursors decreased, show full sequence of maturation with occasional hypolobated neutrophils.  Megakaryocytes decreased, occasional hypolobated forms present. - Cytogenetics: 46, X,-Y[4]/46, XY[16] - MDS FISH panel: - NGS myeloid panel:   2.  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 - PET scan (04/03/2022): Left pyriform fossa and AE fold mass lesion infiltrating epiglottis.  Left level 2 and 3 lymph nodes present. - Underwent chemoradiation therapy with Nimotuzumab (EGFR antibody) and carboplatin. - Subsequent PET scan in July 2024 and  March 2025 did not show any active disease.   3.  Social/family history: - He was seen with his son today.  He is a non-smoker. - No family history of cancer or leukemia.  Plan:  1.  Severe thrombocytopenia/macrocytic anemia: - He received platelet transfusion around 08/13/2023.  He received 1 unit PRBC on 08/19/2023. 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 - Will check CBC today along with blood bank sample. - Will request MDS FISH panel on the bone marrow biopsy. - Will send [facility] panel. - CBC today shows white count is normalized with normal differential.  Hemoglobin is 10.2.  Platelet count is 10,000.  Will give 1 unit of platelets. - I talked to him about empiric therapy for immune mediated thrombocytopenia.  Will give a trial of dexamethasone  40 mg x 4 days. - RTC 2 weeks for follow-up with repeat CBC.  Will check serum EPO level at that time.   Orders Placed This Encounter  Procedures   PNH Profile (-High Sensitivity)    Standing Status:   Future    Number of Occurrences:   1    Expected Date:   08/27/2023    Expiration Date:   11/25/2023   CBC with Differential    Standing Status:   Future    Number of Occurrences:   1    Expected Date:   08/27/2023    Expiration Date:   11/25/2023   Sample to Blood Bank(Blood Bank Hold)    Standing Status:   Future    Number of Occurrences:   1    Expected Date:   08/27/2023  Expiration Date:   11/25/2023      [name],acting as a scribe for [name], MD.,have documented all relevant documentation on the behalf of [name], MD,as directed by  [name], MD while in the presence of [name], MD.  I, [name] MD, have reviewed the above documentation for accuracy and completeness, and I agree with the above.    [name], MD   7/16/20253:49 PM  CHIEF COMPLAINT:   Diagnosis: Severe thrombocytopenia/microcytic anemia   Cancer  Staging  No matching staging information was found for the patient.    Prior Therapy: None  Current Therapy: Under workup   HISTORY OF PRESENT ILLNESS:   Oncology History   No history exists.     INTERVAL HISTORY:   Ian Ryan is a 78 y.o. male presenting to clinic today for follow up of pancytopenia. He was last seen by me on 08/19/2023.  He is accompanied by his son.  Today, he states that he is doing well overall. His appetite level is at 80%. His energy level is at 100%.  PAST MEDICAL HISTORY:   Past Medical History: Past Medical History:  Diagnosis Date   Hypertension     Surgical History: No past surgical history on file.  Social History: Social History   Socioeconomic History   Marital status: Married    Spouse name: Not on file   Number of children: Not on file   Years of education: Not on file   Highest education level: Not on file  Occupational History   Not on file  Tobacco Use   Smoking status: Never   Smokeless tobacco: Never  Substance and Sexual Activity   Alcohol  use: Not Currently   Drug use: Not Currently   Sexual activity: Not on file  Other Topics Concern   Not on file  Social History Narrative   Not on file   Social Drivers of Health   Financial Resource Strain: Not on file  Food Insecurity: Not on file  Transportation Needs: Not on file  Physical Activity: Not on file  Stress: Not on file  Social Connections: Not on file  Intimate Partner Violence: Not on file    Family History: No family history on file.  Current Medications:  Current Outpatient Medications:    losartan  (COZAAR ) 50 MG tablet, Take 1.5 tablets (75 mg total) by mouth daily., Disp: 45 tablet, Rfl: 0   pantoprazole (PROTONIX) 40 MG tablet, Take 40 mg by mouth daily., Disp: , Rfl:    PRESCRIPTION MEDICATION, Repay blood pressure med from [place], Disp: , Rfl:    traMADol -acetaminophen  (ULTRACET ) 37.5-325 MG tablet, Take 1-2 tablets at bedtime by mouth. May cause  constipation., Disp: 10 tablet, Rfl: 0   Allergies: No Known Allergies  REVIEW OF SYSTEMS:   Review of Systems  Constitutional:  Negative for chills, fatigue and fever.  HENT:   Positive for trouble swallowing. Negative for lump/mass, mouth sores, nosebleeds and sore throat.   Eyes:  Negative for eye problems.  Respiratory:  Positive for cough and shortness of breath.   Cardiovascular:  Negative for chest pain, leg swelling and palpitations.  Gastrointestinal:  Negative for abdominal pain, constipation, diarrhea, nausea and vomiting.  Genitourinary:  Negative for bladder incontinence, difficulty urinating, dysuria, frequency, hematuria and nocturia.   Musculoskeletal:  Negative for arthralgias, back pain, flank pain, myalgias and neck pain.  Skin:  Negative for itching and rash.  Neurological:  Positive for numbness. Negative for dizziness and headaches.  Hematological:  Does not bruise/bleed easily.  Psychiatric/Behavioral:  Positive for sleep disturbance. Negative for depression and suicidal ideas. The patient is not nervous/anxious.   All other systems reviewed and are negative.    VITALS:   Blood pressure 139/63, pulse 72, temperature 98.6 F (37 C), temperature source Oral, resp. rate 19, weight 122 lb 2.2 oz (55.4 kg), SpO2 100%.  Wt Readings from Last 3 Encounters:  08/27/23 122 lb 2.2 oz (55.4 kg)  08/13/23 120 lb 2.4 oz (54.5 kg)    There is no height or weight on file to calculate BMI.  Performance status (ECOG): 1 - Symptomatic but completely ambulatory  PHYSICAL EXAM:   Physical Exam Vitals and nursing note reviewed. Exam conducted with a chaperone present.  Constitutional:      Appearance: Normal appearance.  Cardiovascular:     Rate and Rhythm: Normal rate and regular rhythm.     Pulses: Normal pulses.     Heart sounds: Normal heart sounds.  Pulmonary:     Effort: Pulmonary effort is normal.     Breath sounds: Normal breath sounds.  Abdominal:      Palpations: Abdomen is soft. There is no hepatomegaly, splenomegaly or mass.     Tenderness: There is no abdominal tenderness.  Musculoskeletal:     Right lower leg: No edema.     Left lower leg: No edema.  Lymphadenopathy:     Cervical: No cervical adenopathy.     Right cervical: No superficial, deep or posterior cervical adenopathy.    Left cervical: No superficial, deep or posterior cervical adenopathy.     Upper Body:     Right upper body: No supraclavicular or axillary adenopathy.     Left upper body: No supraclavicular or axillary adenopathy.  Neurological:     General: No focal deficit present.     Mental Status: He is alert and oriented to person, place, and time.  Psychiatric:        Mood and Affect: Mood normal.        Behavior: Behavior normal.     LABS:   CBC     Component Value Date/Time   WBC 2.8 (L) 08/19/2023 0818   RBC 2.16 (L) 08/19/2023 0818   HGB 7.5 (L) 08/19/2023 0818   HCT 23.2 (L) 08/19/2023 0818   PLT 26 (LL) 08/19/2023 0818   MCV 107.4 (H) 08/19/2023 0818   MCH 34.7 (H) 08/19/2023 0818   MCHC 32.3 08/19/2023 0818   RDW 15.2 08/19/2023 0818   LYMPHSABS 1.0 08/19/2023 0818   MONOABS 0.3 08/19/2023 0818   EOSABS 0.2 08/19/2023 0818   BASOSABS 0.0 08/19/2023 0818    CMP      Component Value Date/Time   NA 135 09/03/2020 1214   K 4.9 09/03/2020 1214   CL 103 09/03/2020 1214   CO2 26 09/03/2020 1214   GLUCOSE 103 (H) 09/03/2020 1214   BUN 9 09/03/2020 1214   CREATININE 1.06 09/03/2020 1214   CALCIUM 10.0 09/03/2020 1214   PROT 7.4 09/03/2020 1214   ALBUMIN 3.8 09/03/2020 1214   AST 19 09/03/2020 1214   ALT 17 09/03/2020 1214   ALKPHOS 71 09/03/2020 1214   BILITOT 0.8 09/03/2020 1214   GFRNONAA >60 09/03/2020 1214     No results found for: CEA1, CEA / No results found for: CEA1, CEA No results found for: PSA1 No results found for: CAN199 No results found for: RJW874  Lab Results  Component Value Date   TOTALPROTELP  7.4 08/13/2023  TOTALPROTELP 7.5 08/13/2023   ALBUMINELP 3.8 08/13/2023   A1GS 0.3 08/13/2023   A2GS 0.7 08/13/2023   BETS 1.2 08/13/2023   GAMS 1.4 08/13/2023   MSPIKE Not Observed 08/13/2023   SPEI Comment 08/13/2023   Lab Results  Component Value Date   TIBC 362 08/13/2023   FERRITIN 37 08/13/2023   IRONPCTSAT 24 08/13/2023   Lab Results  Component Value Date   LDH 127 08/13/2023     STUDIES:   No results found.

## 2023-08-27 ENCOUNTER — Inpatient Hospital Stay: Admitting: Oncology

## 2023-08-27 ENCOUNTER — Inpatient Hospital Stay (HOSPITAL_BASED_OUTPATIENT_CLINIC_OR_DEPARTMENT_OTHER): Admitting: Hematology

## 2023-08-27 ENCOUNTER — Encounter (HOSPITAL_COMMUNITY): Payer: Self-pay | Admitting: Hematology

## 2023-08-27 VITALS — BP 139/63 | HR 72 | Temp 98.6°F | Resp 19 | Wt 122.1 lb

## 2023-08-27 DIAGNOSIS — Z79899 Other long term (current) drug therapy: Secondary | ICD-10-CM | POA: Diagnosis not present

## 2023-08-27 DIAGNOSIS — Z85818 Personal history of malignant neoplasm of other sites of lip, oral cavity, and pharynx: Secondary | ICD-10-CM | POA: Diagnosis not present

## 2023-08-27 DIAGNOSIS — D469 Myelodysplastic syndrome, unspecified: Secondary | ICD-10-CM | POA: Diagnosis not present

## 2023-08-27 DIAGNOSIS — D61818 Other pancytopenia: Secondary | ICD-10-CM

## 2023-08-27 DIAGNOSIS — Z9221 Personal history of antineoplastic chemotherapy: Secondary | ICD-10-CM | POA: Diagnosis not present

## 2023-08-27 DIAGNOSIS — D539 Nutritional anemia, unspecified: Secondary | ICD-10-CM | POA: Diagnosis not present

## 2023-08-27 DIAGNOSIS — I10 Essential (primary) hypertension: Secondary | ICD-10-CM | POA: Diagnosis not present

## 2023-08-27 DIAGNOSIS — R779 Abnormality of plasma protein, unspecified: Secondary | ICD-10-CM | POA: Diagnosis not present

## 2023-08-27 DIAGNOSIS — Z923 Personal history of irradiation: Secondary | ICD-10-CM | POA: Diagnosis not present

## 2023-08-27 DIAGNOSIS — Z8589 Personal history of malignant neoplasm of other organs and systems: Secondary | ICD-10-CM | POA: Diagnosis not present

## 2023-08-27 DIAGNOSIS — E7800 Pure hypercholesterolemia, unspecified: Secondary | ICD-10-CM | POA: Diagnosis not present

## 2023-08-27 DIAGNOSIS — G4700 Insomnia, unspecified: Secondary | ICD-10-CM | POA: Diagnosis not present

## 2023-08-27 LAB — SAMPLE TO BLOOD BANK

## 2023-08-27 MED ORDER — DEXAMETHASONE 4 MG PO TABS: 40.0000 mg | ORAL_TABLET | Freq: Every day | ORAL | 0 refills | Status: DC

## 2023-08-27 NOTE — Patient Instructions (Addendum)
 [facility] at [facility] Discharge Instructions   You were seen and examined today by Dr. Rogers.  He reviewed the results of your bone marrow biopsy which did not show anything bad.  We will repeat lab work today. If your platelet count is low we will prescribe a steroid for you to help improve your platelets.   Return as scheduled.    Thank you for choosing [facility] at [facility] to provide your oncology and hematology care.  To afford each patient quality time with our provider, please arrive at least 15 minutes before your scheduled appointment time.   If you have a lab appointment with the Cancer Center please come in thru the Main Entrance and check in at the main information desk.  You need to re-schedule your appointment should you arrive 10 or more minutes late.  We strive to give you quality time with our providers, and arriving late affects you and other patients whose appointments are after yours.  Also, if you no show three or more times for appointments you may be dismissed from the clinic at the providers discretion.     Again, thank you for choosing [facility].  Our hope is that these requests will decrease the amount of time that you wait before being seen by our physicians.       _____________________________________________________________  Should you have questions after your visit to [facility], please contact our office at [phone] and follow the prompts.  Our office hours are 8:00 a.m. and 4:30 p.m. Monday - Friday.  Please note that voicemails left after 4:00 p.m. may not be returned until the following business day.  We are closed weekends and major holidays.  You do have access to a nurse 24-7, just call the main number to the clinic [phone] and do not press any options, hold on the line and a nurse will answer the phone.    For prescription refill requests, have your pharmacy  contact our office and allow 72 hours.    Due to Covid, you will need to wear a mask upon entering the hospital. If you do not have a mask, a mask will be given to you at the Main Entrance upon arrival. For doctor visits, patients may have 1 support person age 2 or older with them. For treatment visits, patients can not have anyone with them due to social distancing guidelines and our immunocompromised population.

## 2023-08-27 NOTE — Progress Notes (Signed)
 CRITICAL VALUE ALERT Critical value received:  Platelets 10 Date of notification:  08-27-2023 Time of notification: 16:14 pm. Critical value read back:  Yes.   Nurse who received alert:  B.Bettey Muraoka RN.  MD notified time and response:  Dr. Katragadda @ 16:16 pm.

## 2023-08-28 ENCOUNTER — Other Ambulatory Visit: Payer: Self-pay | Admitting: *Deleted

## 2023-08-28 DIAGNOSIS — D61818 Other pancytopenia: Secondary | ICD-10-CM

## 2023-08-28 LAB — CBC WITH DIFFERENTIAL/PLATELET
Abs Immature Granulocytes: 0.02 K/uL (ref 0.00–0.07)
Basophils Absolute: 0 K/uL (ref 0.0–0.1)
Basophils Relative: 0 %
Eosinophils Absolute: 0.2 K/uL (ref 0.0–0.5)
Eosinophils Relative: 4 %
HCT: 31.3 % — ABNORMAL LOW (ref 39.0–52.0)
Hemoglobin: 10.2 g/dL — ABNORMAL LOW (ref 13.0–17.0)
Immature Granulocytes: 0 %
Lymphocytes Relative: 33 %
Lymphs Abs: 1.6 K/uL (ref 0.7–4.0)
MCH: 32.9 pg (ref 26.0–34.0)
MCHC: 32.6 g/dL (ref 30.0–36.0)
MCV: 101 fL — ABNORMAL HIGH (ref 80.0–100.0)
Monocytes Absolute: 0.5 K/uL (ref 0.1–1.0)
Monocytes Relative: 10 %
Neutro Abs: 2.6 K/uL (ref 1.7–7.7)
Neutrophils Relative %: 53 %
Platelets: 10 K/uL — CL (ref 150–400)
RBC: 3.1 MIL/uL — ABNORMAL LOW (ref 4.22–5.81)
RDW: 18 % — ABNORMAL HIGH (ref 11.5–15.5)
WBC: 5 K/uL (ref 4.0–10.5)
nRBC: 0 % (ref 0.0–0.2)

## 2023-08-29 ENCOUNTER — Inpatient Hospital Stay

## 2023-08-29 DIAGNOSIS — D61818 Other pancytopenia: Secondary | ICD-10-CM | POA: Diagnosis not present

## 2023-08-29 DIAGNOSIS — Z8589 Personal history of malignant neoplasm of other organs and systems: Secondary | ICD-10-CM | POA: Diagnosis not present

## 2023-08-29 DIAGNOSIS — D539 Nutritional anemia, unspecified: Secondary | ICD-10-CM | POA: Diagnosis not present

## 2023-08-29 DIAGNOSIS — Z923 Personal history of irradiation: Secondary | ICD-10-CM | POA: Diagnosis not present

## 2023-08-29 DIAGNOSIS — I10 Essential (primary) hypertension: Secondary | ICD-10-CM | POA: Diagnosis not present

## 2023-08-29 DIAGNOSIS — G4700 Insomnia, unspecified: Secondary | ICD-10-CM | POA: Diagnosis not present

## 2023-08-29 DIAGNOSIS — R779 Abnormality of plasma protein, unspecified: Secondary | ICD-10-CM | POA: Diagnosis not present

## 2023-08-29 DIAGNOSIS — Z9221 Personal history of antineoplastic chemotherapy: Secondary | ICD-10-CM | POA: Diagnosis not present

## 2023-08-29 DIAGNOSIS — E7800 Pure hypercholesterolemia, unspecified: Secondary | ICD-10-CM | POA: Diagnosis not present

## 2023-08-29 DIAGNOSIS — Z85818 Personal history of malignant neoplasm of other sites of lip, oral cavity, and pharynx: Secondary | ICD-10-CM | POA: Diagnosis not present

## 2023-08-29 DIAGNOSIS — Z79899 Other long term (current) drug therapy: Secondary | ICD-10-CM | POA: Diagnosis not present

## 2023-08-29 DIAGNOSIS — D469 Myelodysplastic syndrome, unspecified: Secondary | ICD-10-CM | POA: Diagnosis not present

## 2023-08-29 MED ORDER — DIPHENHYDRAMINE HCL 25 MG PO CAPS
25.0000 mg | ORAL_CAPSULE | Freq: Once | ORAL | Status: AC
  Administered 2023-08-29: 25 mg via ORAL
  Filled 2023-08-29: qty 1

## 2023-08-29 MED ORDER — ACETAMINOPHEN 325 MG PO TABS
650.0000 mg | ORAL_TABLET | Freq: Once | ORAL | Status: AC
  Administered 2023-08-29: 650 mg via ORAL
  Filled 2023-08-29: qty 2

## 2023-08-29 MED ORDER — SODIUM CHLORIDE 0.9% IV SOLUTION
250.0000 mL | INTRAVENOUS | Status: DC
  Administered 2023-08-29: 250 mL via INTRAVENOUS

## 2023-08-29 NOTE — Progress Notes (Signed)
Patient tolerated therapy with no complaints voiced. Side effects with management reviewed with understanding verbalized. IV site clean and dry with no bruising or swelling noted at site. Good blood return noted before and after administration of therapy. Band aid applied. Patient left in satisfactory condition with VSS and no s/s of distress noted.

## 2023-08-29 NOTE — Patient Instructions (Signed)
 [facility] [place] - A DEPT OF [facility]  Discharge Instructions: Thank you for choosing [facility] to provide your oncology and hematology care.  If you have a lab appointment with the Cancer Center - please note that after April 8th, 2024, all labs will be drawn in the cancer center.  You do not have to check in or register with the main entrance as you have in the past but will complete your check-in in the cancer center.  Wear comfortable clothing and clothing appropriate for easy access to any Portacath or PICC line.   We strive to give you quality time with your provider. You may need to reschedule your appointment if you arrive late (15 or more minutes).  Arriving late affects you and other patients whose appointments are after yours.  Also, if you miss three or more appointments without notifying the office, you may be dismissed from the clinic at the provider's discretion.      For prescription refill requests, have your pharmacy contact our office and allow 72 hours for refills to be completed.    Today you received the following 1 unit of platelets, return as scheduled.   To help prevent nausea and vomiting after your treatment, we encourage you to take your nausea medication as directed.  BELOW ARE SYMPTOMS THAT SHOULD BE REPORTED IMMEDIATELY: *FEVER GREATER THAN 100.4 F (38 C) OR HIGHER *CHILLS OR SWEATING *NAUSEA AND VOMITING THAT IS NOT CONTROLLED WITH YOUR NAUSEA MEDICATION *UNUSUAL SHORTNESS OF BREATH *UNUSUAL BRUISING OR BLEEDING *URINARY PROBLEMS (pain or burning when urinating, or frequent urination) *BOWEL PROBLEMS (unusual diarrhea, constipation, pain near the anus) TENDERNESS IN MOUTH AND THROAT WITH OR WITHOUT PRESENCE OF ULCERS (sore throat, sores in mouth, or a toothache) UNUSUAL RASH, SWELLING OR PAIN  UNUSUAL VAGINAL DISCHARGE OR ITCHING   Items with * indicate a potential emergency and should be followed up as soon as  possible or go to the Emergency Department if any problems should occur.  Please show the CHEMOTHERAPY ALERT CARD or IMMUNOTHERAPY ALERT CARD at check-in to the Emergency Department and triage nurse.  Should you have questions after your visit or need to cancel or reschedule your appointment, please contact [facility] [place] - A DEPT OF [facility] [phone]  and follow the prompts.  Office hours are 8:00 a.m. to 4:30 p.m. Monday - Friday. Please note that voicemails left after 4:00 p.m. may not be returned until the following business day.  We are closed weekends and major holidays. You have access to a nurse at all times for urgent questions. Please call the main number to the clinic [phone] and follow the prompts.  For any non-urgent questions, you may also contact your provider using MyChart. We now offer e-Visits for anyone 69 and older to request care online for non-urgent symptoms. For details visit [email or web address].   Also download the MyChart app! Go to the app store, search MyChart, open the app, select [place], and log in with your MyChart username and password.

## 2023-08-30 LAB — BPAM PLATELET PHERESIS
Blood Product Expiration Date: 202507202359
ISSUE DATE / TIME: 202507181047
Unit Type and Rh: 6200

## 2023-08-30 LAB — PREPARE PLATELET PHERESIS: Unit division: 0

## 2023-08-30 NOTE — Progress Notes (Unsigned)
 Referring Provider:  Rosamond Leta NOVAK, MD Primary Care Physician:  Rosamond Leta NOVAK, MD Primary Gastroenterologist:  Dr. Shaaron  Chief Complaint  Patient presents with   New Patient (Initial Visit)    Pt referred for low hemoglobin    HPI:   Ian Ryan is a 78 y.o. male presenting today at the request of  Vyas, Dhruv B, MD for pancytopenia.   1 unit PRBCs on 08/20/2023  1 unit of platelets 08/14/2023 and 08/29/2023  Started on dexamethasone  40 mg x 4 days on 08/27/2023 for possible immune mediated thrombocytopenia.  Iron panel, B12, folate within normal limits 08/13/2023.  Last labs on file 08/27/2023 with hemoglobin 10.2, platelets 10.   Today:  Black stool twice several months ago when he was taking iron intermittently. Hasn't taken any iron in at least 6 months. Has had blood in his stool 3 times. This was also months ago. No constipation or diarrhea. No abdominal pain or rectal pain.   Heartburn intermittently, but not bad. Was prescribed pantoprazole but doesn't feel he needs it. Heartburn was new in the last few months. Also with dysphagia, but this started in the setting of cancer and his neck.  Patient brought prior CT scans which were completed in [place].  CT in February 2024 with malignant left piriform fossa and AE fold mass lesion infiltrating epiglottis with few metastatic left level 2 and 3 lymph nodes.  Patient states he underwent 4 cycles of chemotherapy and over 30 radiation treatments in [place].  CT in July 2024 with post radiotherapy status without recurrent disease, complete resolution of left piriform fossa mass with development of diffuse laryngeal edema.  Most recent CT scan 05/20/2023 with mild reduction in severity of laryngeal edema without recurrent disease.  No significant change in size of left level 2 and 3 lymph nodes.  Patient states his dysphagia has improved since treatment.  He does have some trouble with solid foods at times, but items pass with drinking  liquids.   No prior EGD or colonoscopy.  No Fhx of colon cancer.   No NSAIDs  Past Medical History:  Diagnosis Date   Hypertension    Pyriform sinus mass    Malignant s/p chemo and radiation in 2024 in [place]    History reviewed. No pertinent surgical history.  Current Outpatient Medications  Medication Sig Dispense Refill   bimatoprost (LUMIGAN) 0.03 % ophthalmic solution 1 drop at bedtime.     misoprostol (CYTOTEC) 100 MCG tablet Take 100 mcg by mouth 4 (four) times daily.     telmisartan (MICARDIS) 20 MG tablet Take 25 mg by mouth daily.     No current facility-administered medications for this visit.    Allergies as of 09/01/2023   (No Known Allergies)    Family History  Problem Relation Age of Onset   Colon cancer Neg Hx     Social History   Socioeconomic History   Marital status: Married    Spouse name: Not on file   Number of children: Not on file   Years of education: Not on file   Highest education level: Not on file  Occupational History   Not on file  Tobacco Use   Smoking status: Never   Smokeless tobacco: Never  Substance and Sexual Activity   Alcohol  use: Not Currently   Drug use: Not Currently   Sexual activity: Not on file  Other Topics Concern   Not on file  Social History Narrative   Not on file  Social Drivers of Corporate investment banker Strain: Not on file  Food Insecurity: Not on file  Transportation Needs: Not on file  Physical Activity: Not on file  Stress: Not on file  Social Connections: Not on file  Intimate Partner Violence: Not on file    Review of Systems: Gen: Denies any fever, chills, cold or flulike symptoms, presyncope, syncope. CV: Denies chest pain, heart palpitations. Resp: Denies shortness of breath cough. GI: See HPI GU : Denies urinary burning, urinary frequency, urinary hesitancy MS: Denies joint pain. Derm: Denies rash. Psych: Denies depression, anxiety. Heme: See HPI  Physical Exam: BP (!)  154/73   Pulse 67   Temp 98.4 F (36.9 C)   Ht 5' 2 (1.575 m)   Wt 125 lb 12.8 oz (57.1 kg)   BMI 23.01 kg/m  General:   Alert and oriented. Pleasant and cooperative. Well-nourished and well-developed.  Head:  Normocephalic and atraumatic. Eyes:  Without icterus, sclera clear and conjunctiva pink.  Ears:  Normal auditory acuity. Lungs:  Clear to auscultation bilaterally. No wheezes, rales, or rhonchi. No distress.  Heart:  S1, S2 present without murmurs appreciated.  Abdomen:  +BS, soft, non-tender and non-distended. No HSM noted. No guarding or rebound. No masses appreciated.  Rectal:  Deferred  Msk:  Symmetrical without gross deformities. Normal posture. Extremities:  Without edema. Neurologic:  Alert and  oriented x4;  grossly normal neurologically. Skin:  Intact without significant lesions or rashes. Psych:   Normal mood and affect.    Assessment:  78 year old male with history of HTN, pancytopenia, malignant piriform fossa mass s/p chemo and radiation last year, presenting today at the request of  Vyas, Dhruv B, MD for pancytopenia.   Pancytopenia:  Following with Dr. Katragadda without clear explanation thus far.  Workup is still underway.  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  Iron panel, B12, folate within normal limits on 08/13/2023.  Most recent labs 08/27/2023 with hemoglobin 10.2, platelets 10.  Scheduled for follow-up with Dr. Katragadda on 09/10/2023.  Patient does report having 2 episodes of black stools several months ago in the setting of intermittent iron use, but denies any iron supplementation in the last 6 months.  He also reports about 3 episodes of rectal bleeding several months ago with no recurrent symptoms.  No other lower GI symptoms.  Denies unintentional weight loss.  No known family history of colon cancer.   Patient has never had EGD or colonoscopy.  Though patient does not have iron deficiency at this time, we discussed the possibility of colonoscopy and possible upper endoscopy.  Patient is interested in proceeding with a colonoscopy, but due to severe thrombocytopenia of unknown etiology and inadequate response to recent platelet transfusion, would prefer that patient have follow-up with Dr. Rogers prior to scheduling colonoscopy.  Hopefully, he has responded dexamethasone  and platelets will stabilize.  We can arrange for platelets to be given prior to colonoscopy if needed, but would need to review the case with Dr. Shaaron prior to scheduling.  As patient has follow-up with Dr. Rogers on 7/30 and will have blood work completed at that time, we will hold off on scheduling anything until after this follow-up.  Due to thrombocytopenia, I will go ahead and order an ultrasound to evaluate for splenomegaly and underlying liver disease.  Dysphagia:  Likely secondary to malignant piriform fossa  mass which was diagnosed in February 2024.  Now s/p treatment with chemotherapy and radiation.  Most recent CT in April 2025 shows no recurrent disease with mild reduction in severity of laryngeal edema.  Overall, patient's dysphagia has improved.  Still with some mild solid food dysphagia, but items pass with drinking liquids.  These residual symptoms may very well be related to pharyngeal edema in the setting of radiation.  We discussed the possibility of an upper endoscopy, but patient/son would prefer to hold off on this.    Plan:  Abdominal US  We will consider scheduling colonoscopy pending follow-up with Dr. Rogers with repeat labs.  Ideally, platelets need to be at least above 30,000.  Can consider platelet transfusion prior to colonoscopy if needed.  Ultimately, we will plan to discuss case with Dr. Shaaron pending patient's follow-up with Dr. Rogers.    Josette Centers, PA-C [facility]  Gastroenterology 09/01/2023

## 2023-08-31 LAB — PNH PROFILE (-HIGH SENSITIVITY)

## 2023-09-01 ENCOUNTER — Encounter: Payer: Self-pay | Admitting: Gastroenterology

## 2023-09-01 ENCOUNTER — Ambulatory Visit (INDEPENDENT_AMBULATORY_CARE_PROVIDER_SITE_OTHER): Admitting: Gastroenterology

## 2023-09-01 ENCOUNTER — Telehealth: Payer: Self-pay | Admitting: *Deleted

## 2023-09-01 VITALS — BP 154/73 | HR 67 | Temp 98.4°F | Ht 62.0 in | Wt 125.8 lb

## 2023-09-01 DIAGNOSIS — D61818 Other pancytopenia: Secondary | ICD-10-CM | POA: Diagnosis not present

## 2023-09-01 DIAGNOSIS — D696 Thrombocytopenia, unspecified: Secondary | ICD-10-CM

## 2023-09-01 DIAGNOSIS — R1310 Dysphagia, unspecified: Secondary | ICD-10-CM

## 2023-09-01 DIAGNOSIS — D649 Anemia, unspecified: Secondary | ICD-10-CM

## 2023-09-01 NOTE — Telephone Encounter (Signed)
 Called pt and he is aware US  scheduled for [facility] entrance A, 7/25, arrival 8:45am, npo midnight.

## 2023-09-01 NOTE — Patient Instructions (Addendum)
 We will arrange for you to have an ultrasound of your abdomen at [facility].  I would like to schedule you for colonoscopy in the near future, but we will need to hold off on scheduling this until your platelets have stabilized and are above 30.  Further recommendations pending your follow-up with Dr. Katragadda.  Josette Centers, PA-C [facility]

## 2023-09-05 ENCOUNTER — Ambulatory Visit (HOSPITAL_COMMUNITY)
Admission: RE | Admit: 2023-09-05 | Discharge: 2023-09-05 | Disposition: A | Discharge status: Home / Self Care | Source: Ambulatory Visit | Attending: Gastroenterology | Admitting: Gastroenterology

## 2023-09-05 DIAGNOSIS — D696 Thrombocytopenia, unspecified: Secondary | ICD-10-CM | POA: Insufficient documentation

## 2023-09-10 ENCOUNTER — Inpatient Hospital Stay: Admitting: Oncology

## 2023-09-10 ENCOUNTER — Inpatient Hospital Stay (HOSPITAL_BASED_OUTPATIENT_CLINIC_OR_DEPARTMENT_OTHER): Admitting: Hematology

## 2023-09-10 VITALS — BP 135/52 | HR 74 | Temp 98.1°F | Resp 18 | Wt 121.2 lb

## 2023-09-10 DIAGNOSIS — Z85818 Personal history of malignant neoplasm of other sites of lip, oral cavity, and pharynx: Secondary | ICD-10-CM | POA: Diagnosis not present

## 2023-09-10 DIAGNOSIS — R779 Abnormality of plasma protein, unspecified: Secondary | ICD-10-CM | POA: Diagnosis not present

## 2023-09-10 DIAGNOSIS — D61818 Other pancytopenia: Secondary | ICD-10-CM | POA: Diagnosis not present

## 2023-09-10 DIAGNOSIS — Z9221 Personal history of antineoplastic chemotherapy: Secondary | ICD-10-CM | POA: Diagnosis not present

## 2023-09-10 DIAGNOSIS — Z8589 Personal history of malignant neoplasm of other organs and systems: Secondary | ICD-10-CM | POA: Diagnosis not present

## 2023-09-10 DIAGNOSIS — Z923 Personal history of irradiation: Secondary | ICD-10-CM | POA: Diagnosis not present

## 2023-09-10 DIAGNOSIS — I10 Essential (primary) hypertension: Secondary | ICD-10-CM | POA: Diagnosis not present

## 2023-09-10 DIAGNOSIS — Z79899 Other long term (current) drug therapy: Secondary | ICD-10-CM | POA: Diagnosis not present

## 2023-09-10 DIAGNOSIS — G4700 Insomnia, unspecified: Secondary | ICD-10-CM | POA: Diagnosis not present

## 2023-09-10 DIAGNOSIS — E7800 Pure hypercholesterolemia, unspecified: Secondary | ICD-10-CM | POA: Diagnosis not present

## 2023-09-10 DIAGNOSIS — D469 Myelodysplastic syndrome, unspecified: Secondary | ICD-10-CM | POA: Diagnosis not present

## 2023-09-10 DIAGNOSIS — D539 Nutritional anemia, unspecified: Secondary | ICD-10-CM | POA: Diagnosis not present

## 2023-09-10 LAB — SAMPLE TO BLOOD BANK

## 2023-09-10 MED ORDER — TRAZODONE HCL 50 MG PO TABS: 50.0000 mg | ORAL_TABLET | Freq: Every day | ORAL | 2 refills | Status: DC

## 2023-09-10 NOTE — Progress Notes (Signed)
 [facility] [address] [place] [address]    Clinic Day:  09/10/2023  Referring physician: Rosamond Leta NOVAK, MD  Patient Care Team: Rosamond Leta NOVAK, MD as PCP - General (Internal Medicine)   ASSESSMENT & PLAN:   Assessment: 1.  Severe pancytopenia: - Patient seen at the request of Dr. Rosamond - Ian Ryan had a history of thrombocytopenia for several years with platelet count ranging between 40-65 since 2022. - Recent CBC on 08/05/2023: WBC-3.2, ANC-1.3, (39% N, 42% L, 11% M), Hb-8.4, PLT-8, MCV-108. - 12/01/2021: WBC-4.8, Hb-12.4, PLT-65, MCV-97, ANC-2.2 - 11/22/2020: WBC-4.7, Hb-13.1, PLT-49 - 09/03/2020: WBC-5.3, Hb-13.2, PLT-41 - BMBX done in [place] (06/05/2021): Megakaryocytes are reduced.  Increased erythropoiesis.  Granulopoiesis is adequate.  Reticulin stain is negative. - No prior history of transfusions.  No recent infections.  No new medications.  Denies any bleeding or bruising.  Ian Ryan lost about 11 pounds in the last 6 months. Ian Ryan reports taking B12 supplements in the past until a year ago. 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  Mild dyspoiesis in all lineages.  However it does not reach the threshold of 10% for dysplasia.  Differential includes evolving MDS.  Erythroid precursors are relatively increased, show full sequence of maturation with megaloblastoid change.  Granulocyte precursors decreased, show full sequence of maturation with occasional hypolobated neutrophils.  Megakaryocytes decreased, occasional hypolobated forms present. 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 - PET scan (04/03/2022): Left pyriform fossa and AE fold mass lesion infiltrating epiglottis.  Left level 2 and 3 lymph nodes present. - Underwent  chemoradiation therapy with Nimotuzumab (EGFR antibody) and carboplatin. - Subsequent PET scan in July 2024 and March 2025 did not show any active disease.   3.  Social/family history: - Ian Ryan was seen with his son today.  Ian Ryan is a non-smoker. - No family history of cancer or leukemia.  Plan:  1.  Severe thrombocytopenia/macrocytic anemia: - Ian Ryan denies any bleeding issues.  No BRBPR/melena. - Ian Ryan tried dexamethasone  40 mg daily for 4 days, started on 08/29/2023. - CBC today shows platelet count 21, with no/suboptimal response from dexamethasone .  Hemoglobin today is 9.3.  Ian Ryan does not require any transfusions.  White count is normal.  PNH panel is negative.  Serum EPO level pending.  NGS myeloid panel is pending. - I have recommended second opinion consultation at bone marrow failure clinic at [facility]. - Will continue to monitor his CBC every 3 weeks to see if Ian Ryan needs transfusion.  Will follow-up with him in 12 weeks.  2.  Difficulty staying asleep: - Ian Ryan wakes up in 2 hours after falling asleep.  Ian Ryan has tried melatonin which did not help.  Will start him on trazodone  50 mg at bedtime.  We can titrate up to 100 mg at bedtime as needed.   No orders of the defined types were placed in this encounter.     [name],acting as a Neurosurgeon for [name], MD.,have documented all relevant documentation on the behalf of [name], MD,as directed by  [name], MD while in the presence of [name], MD.  I, [name] MD, have reviewed the above documentation for accuracy and completeness, and I agree with the above.  [name], MD   7/30/20254:13 PM  CHIEF COMPLAINT:   Diagnosis: Severe thrombocytopenia/microcytic anemia   Cancer Staging  No matching staging information was found for the patient.    Prior Therapy: None  Current Therapy: Under workup   HISTORY OF PRESENT ILLNESS:   Oncology History   No  history exists.     INTERVAL HISTORY:   Ian Ryan is a 78 y.o. male presenting to clinic today for follow up of pancytopenia. Ian Ryan was last seen by me on 08/27/2023.    Today, Ian Ryan states that Ian Ryan is doing well overall. His appetite level is at 100%. His energy level is at 75%.  PAST MEDICAL HISTORY:   Past Medical History: Past Medical History:  Diagnosis Date   Hypertension    Pyriform sinus mass    Malignant s/p chemo and radiation in 2024 in [place]    Surgical History: No past surgical history on file.  Social History: Social History   Socioeconomic History   Marital status: Married    Spouse name: Not on file   Number of children: Not on file   Years of education: Not on file   Highest education level: Not on file  Occupational History   Not on file  Tobacco Use   Smoking status: Never   Smokeless tobacco: Never  Substance and Sexual Activity   Alcohol  use: Not Currently   Drug use: Not Currently   Sexual activity: Not on file  Other Topics Concern   Not on file  Social History Narrative   Not on file   Social Drivers of Health   Financial Resource Strain: Not on file  Food Insecurity: Not on file  Transportation Needs: Not on file  Physical Activity: Not on file  Stress: Not on file  Social Connections: Not on file  Intimate Partner Violence: Not on file    Family History: Family History  Problem Relation Age of Onset   Colon cancer Neg Hx     Current Medications:  Current Outpatient Medications:    bimatoprost (LUMIGAN) 0.03 % ophthalmic solution, 1 drop at bedtime., Disp: , Rfl:    misoprostol (CYTOTEC) 100 MCG tablet, Take 100 mcg by mouth 4 (four) times daily., Disp: , Rfl:    telmisartan (MICARDIS) 20 MG tablet, Take 25 mg by mouth daily., Disp: , Rfl:    traZODone  (DESYREL ) 50 MG tablet, Take 1 tablet (50 mg total) by mouth at bedtime., Disp: 30 tablet, Rfl: 2   Allergies: No Known Allergies  REVIEW OF SYSTEMS:   Review of Systems   Constitutional:  Negative for chills, fatigue and fever.  HENT:   Negative for lump/mass, mouth sores, nosebleeds, sore throat and trouble swallowing.   Eyes:  Negative for eye problems.  Respiratory:  Negative for cough and shortness of breath.   Cardiovascular:  Negative for chest pain, leg swelling and palpitations.  Gastrointestinal:  Negative for abdominal pain, constipation, diarrhea, nausea and vomiting.  Genitourinary:  Negative for bladder incontinence, difficulty urinating, dysuria, frequency, hematuria and nocturia.   Musculoskeletal:  Negative for arthralgias, back pain, flank pain, myalgias and neck pain.  Skin:  Negative for itching and rash.  Neurological:  Positive for numbness. Negative for dizziness and headaches.  Hematological:  Does not bruise/bleed easily.  Psychiatric/Behavioral:  Positive for sleep disturbance. Negative for depression and suicidal ideas. The patient is not nervous/anxious.   All other systems reviewed and are negative.    VITALS:   Blood pressure (!) 135/52, pulse 74, temperature  98.1 F (36.7 C), temperature source Oral, resp. rate 18, weight 121 lb 3.2 oz (55 kg), SpO2 100%.  Wt Readings from Last 3 Encounters:  09/10/23 121 lb 3.2 oz (55 kg)  09/01/23 125 lb 12.8 oz (57.1 kg)  08/27/23 122 lb 2.2 oz (55.4 kg)    Body mass index is 22.17 kg/m.  Performance status (ECOG): 1 - Symptomatic but completely ambulatory  PHYSICAL EXAM:   Physical Exam Vitals and nursing note reviewed. Exam conducted with a chaperone present.  Constitutional:      Appearance: Normal appearance.  Cardiovascular:     Rate and Rhythm: Normal rate and regular rhythm.     Pulses: Normal pulses.     Heart sounds: Normal heart sounds.  Pulmonary:     Effort: Pulmonary effort is normal.     Breath sounds: Normal breath sounds.  Abdominal:     Palpations: Abdomen is soft. There is no hepatomegaly, splenomegaly or mass.     Tenderness: There is no abdominal  tenderness.  Musculoskeletal:     Right lower leg: No edema.     Left lower leg: No edema.  Lymphadenopathy:     Cervical: No cervical adenopathy.     Right cervical: No superficial, deep or posterior cervical adenopathy.    Left cervical: No superficial, deep or posterior cervical adenopathy.     Upper Body:     Right upper body: No supraclavicular or axillary adenopathy.     Left upper body: No supraclavicular or axillary adenopathy.  Neurological:     General: No focal deficit present.     Mental Status: Ian Ryan is alert and oriented to person, place, and time.  Psychiatric:        Mood and Affect: Mood normal.        Behavior: Behavior normal.     LABS:   CBC     Component Value Date/Time   WBC 4.4 09/10/2023 1425   RBC 2.84 (L) 09/10/2023 1425   HGB 9.3 (L) 09/10/2023 1425   HCT 28.5 (L) 09/10/2023 1425   PLT 21 (LL) 09/10/2023 1425   MCV 100.4 (H) 09/10/2023 1425   MCH 32.7 09/10/2023 1425   MCHC 32.6 09/10/2023 1425   RDW 17.8 (H) 09/10/2023 1425   LYMPHSABS 1.1 09/10/2023 1425   MONOABS 0.4 09/10/2023 1425   EOSABS 0.1 09/10/2023 1425   BASOSABS 0.0 09/10/2023 1425    CMP      Component Value Date/Time   NA 135 09/03/2020 1214   K 4.9 09/03/2020 1214   CL 103 09/03/2020 1214   CO2 26 09/03/2020 1214   GLUCOSE 103 (H) 09/03/2020 1214   BUN 9 09/03/2020 1214   CREATININE 1.06 09/03/2020 1214   CALCIUM 10.0 09/03/2020 1214   PROT 7.4 09/03/2020 1214   ALBUMIN 3.8 09/03/2020 1214   AST 19 09/03/2020 1214   ALT 17 09/03/2020 1214   ALKPHOS 71 09/03/2020 1214   BILITOT 0.8 09/03/2020 1214   GFRNONAA >60 09/03/2020 1214     No results found for: CEA1, CEA / No results found for: CEA1, CEA No results found for: PSA1 No results found for: CAN199 No results found for: RJW874  Lab Results  Component Value Date   TOTALPROTELP 7.4 08/13/2023   TOTALPROTELP 7.5 08/13/2023   ALBUMINELP 3.8 08/13/2023   A1GS 0.3 08/13/2023   A2GS 0.7  08/13/2023   BETS 1.2 08/13/2023   GAMS 1.4 08/13/2023   MSPIKE Not Observed 08/13/2023   SPEI Comment 08/13/2023  Lab Results  Component Value Date   TIBC 362 08/13/2023   FERRITIN 37 08/13/2023   IRONPCTSAT 24 08/13/2023   Lab Results  Component Value Date   LDH 127 08/13/2023     STUDIES:   No results found.

## 2023-09-10 NOTE — Patient Instructions (Signed)
 [facility] at [facility] Discharge Instructions   You were seen and examined today by Dr. Rogers.  He reviewed the results of your lab work. Your platelets today are 21,000, up from 10,000. The steroids did not help much.  Dr. MARLA would like for you to see a specialist at [facility]). We will make this referral.   We will monitor your blood work every 3 weeks.   We will see you back in 12 weeks.   Return as scheduled.    Thank you for choosing [facility] at [facility] to provide your oncology and hematology care.  To afford each patient quality time with our provider, please arrive at least 15 minutes before your scheduled appointment time.   If you have a lab appointment with the Cancer Center please come in thru the Main Entrance and check in at the main information desk.  You need to re-schedule your appointment should you arrive 10 or more minutes late.  We strive to give you quality time with our providers, and arriving late affects you and other patients whose appointments are after yours.  Also, if you no show three or more times for appointments you may be dismissed from the clinic at the providers discretion.     Again, thank you for choosing [facility].  Our hope is that these requests will decrease the amount of time that you wait before being seen by our physicians.       _____________________________________________________________  Should you have questions after your visit to [facility], please contact our office at [phone] and follow the prompts.  Our office hours are 8:00 a.m. and 4:30 p.m. Monday - Friday.  Please note that voicemails left after 4:00 p.m. may not be returned until the following business day.  We are closed weekends and major holidays.  You do have access to a nurse 24-7, just call the main number to the clinic [phone] and do not press any  options, hold on the line and a nurse will answer the phone.    For prescription refill requests, have your pharmacy contact our office and allow 72 hours.    Due to Covid, you will need to wear a mask upon entering the hospital. If you do not have a mask, a mask will be given to you at the Main Entrance upon arrival. For doctor visits, patients may have 1 support person age 26 or older with them. For treatment visits, patients can not have anyone with them due to social distancing guidelines and our immunocompromised population.

## 2023-09-10 NOTE — Progress Notes (Signed)
 CRITICAL VALUE ALERT Critical value received:  Platelets 21,000 Date of notification:  09-10-2023 Time of notification: 15:20 pm Critical value read back:  Yes.   Nurse who received alert:  B.Maxson Oddo RN.  MD notified time and response:  Dr. Katragadda @ 15:22 pm.

## 2023-09-11 LAB — CBC WITH DIFFERENTIAL/PLATELET
Basophils Absolute: 0 K/uL (ref 0.0–0.1)
Basophils Relative: 0 %
Eosinophils Absolute: 0.1 K/uL (ref 0.0–0.5)
Eosinophils Relative: 3 %
HCT: 28.5 % — ABNORMAL LOW (ref 39.0–52.0)
Hemoglobin: 9.3 g/dL — ABNORMAL LOW (ref 13.0–17.0)
Lymphocytes Relative: 24 %
Lymphs Abs: 1.1 K/uL (ref 0.7–4.0)
MCH: 32.7 pg (ref 26.0–34.0)
MCHC: 32.6 g/dL (ref 30.0–36.0)
MCV: 100.4 fL — ABNORMAL HIGH (ref 80.0–100.0)
Monocytes Absolute: 0.4 K/uL (ref 0.1–1.0)
Monocytes Relative: 9 %
Neutro Abs: 2.8 K/uL (ref 1.7–7.7)
Neutrophils Relative %: 64 %
Platelets: 21 K/uL — CL (ref 150–400)
RBC: 2.84 MIL/uL — ABNORMAL LOW (ref 4.22–5.81)
RDW: 17.8 % — ABNORMAL HIGH (ref 11.5–15.5)
WBC: 4.4 K/uL (ref 4.0–10.5)
nRBC: 0 % (ref 0.0–0.2)

## 2023-09-11 LAB — MISC LABCORP TEST (SEND OUT): Labcorp test code: 452312

## 2023-09-11 LAB — ERYTHROPOIETIN: Erythropoietin: 167.1 m[IU]/mL — ABNORMAL HIGH (ref 2.6–18.5)

## 2023-09-14 ENCOUNTER — Ambulatory Visit: Payer: Self-pay | Admitting: Gastroenterology

## 2023-09-16 DIAGNOSIS — J411 Mucopurulent chronic bronchitis: Secondary | ICD-10-CM | POA: Diagnosis not present

## 2023-09-16 DIAGNOSIS — I10 Essential (primary) hypertension: Secondary | ICD-10-CM | POA: Diagnosis not present

## 2023-09-16 DIAGNOSIS — D61818 Other pancytopenia: Secondary | ICD-10-CM | POA: Diagnosis not present

## 2023-09-16 DIAGNOSIS — Z0000 Encounter for general adult medical examination without abnormal findings: Secondary | ICD-10-CM | POA: Diagnosis not present

## 2023-09-16 DIAGNOSIS — Z299 Encounter for prophylactic measures, unspecified: Secondary | ICD-10-CM | POA: Diagnosis not present

## 2023-09-17 ENCOUNTER — Other Ambulatory Visit (HOSPITAL_COMMUNITY): Payer: Self-pay | Admitting: Internal Medicine

## 2023-09-17 DIAGNOSIS — R059 Cough, unspecified: Secondary | ICD-10-CM

## 2023-09-22 DIAGNOSIS — D61818 Other pancytopenia: Secondary | ICD-10-CM | POA: Diagnosis not present

## 2023-09-23 ENCOUNTER — Telehealth: Payer: Self-pay | Admitting: Gastroenterology

## 2023-09-23 DIAGNOSIS — D61818 Other pancytopenia: Secondary | ICD-10-CM | POA: Diagnosis not present

## 2023-09-23 NOTE — Telephone Encounter (Signed)
 Please let patient know that I reviewed his case with Dr. Shaaron. Recommend holding off on colonoscopy for now as his platelets are too low. We recommend seeing hematology at [facility] as Dr. MARLA recommended, and we can circle back to scheduling his colonoscopy once the platelets are improved.   We can plan to see him back in the office in 3 months to re-evaluate or he can call us  when he is ready to schedule after he has been evaluated by [facility].   Please let me know what he decides.

## 2023-09-24 NOTE — Telephone Encounter (Signed)
 Spoke to pt's son (DPR) informed him of recommendations. He voiced understanding.

## 2023-09-24 NOTE — Telephone Encounter (Signed)
 Spoke with [employer]. States patient was going to wait to be seen at [facility] prior to arranging follow-up with us .

## 2023-09-25 ENCOUNTER — Encounter: Payer: Self-pay | Admitting: Oncology

## 2023-09-26 ENCOUNTER — Other Ambulatory Visit (HOSPITAL_COMMUNITY)

## 2023-09-29 ENCOUNTER — Ambulatory Visit (HOSPITAL_COMMUNITY)
Admission: RE | Admit: 2023-09-29 | Discharge: 2023-09-29 | Disposition: A | Discharge status: Home / Self Care | Source: Ambulatory Visit | Attending: Internal Medicine | Admitting: Internal Medicine

## 2023-09-29 DIAGNOSIS — R911 Solitary pulmonary nodule: Secondary | ICD-10-CM | POA: Insufficient documentation

## 2023-09-29 DIAGNOSIS — R059 Cough, unspecified: Secondary | ICD-10-CM | POA: Diagnosis not present

## 2023-09-29 DIAGNOSIS — I2510 Atherosclerotic heart disease of native coronary artery without angina pectoris: Secondary | ICD-10-CM | POA: Insufficient documentation

## 2023-09-29 DIAGNOSIS — I700 Atherosclerosis of aorta: Secondary | ICD-10-CM | POA: Diagnosis not present

## 2023-09-29 DIAGNOSIS — Q408 Other specified congenital malformations of upper alimentary tract: Secondary | ICD-10-CM | POA: Diagnosis not present

## 2023-09-29 DIAGNOSIS — J479 Bronchiectasis, uncomplicated: Secondary | ICD-10-CM | POA: Insufficient documentation

## 2023-09-29 DIAGNOSIS — J984 Other disorders of lung: Secondary | ICD-10-CM | POA: Diagnosis not present

## 2023-09-29 DIAGNOSIS — R918 Other nonspecific abnormal finding of lung field: Secondary | ICD-10-CM | POA: Diagnosis not present

## 2023-10-01 ENCOUNTER — Inpatient Hospital Stay: Attending: Hematology | Admitting: Oncology

## 2023-10-01 ENCOUNTER — Other Ambulatory Visit: Payer: Self-pay

## 2023-10-01 DIAGNOSIS — D61818 Other pancytopenia: Secondary | ICD-10-CM

## 2023-10-01 LAB — CBC
HCT: 31.3 % — ABNORMAL LOW (ref 39.0–52.0)
Hemoglobin: 10.1 g/dL — ABNORMAL LOW (ref 13.0–17.0)
MCH: 32.5 pg (ref 26.0–34.0)
MCHC: 32.3 g/dL (ref 30.0–36.0)
MCV: 100.6 fL — ABNORMAL HIGH (ref 80.0–100.0)
Platelets: 33 K/uL — ABNORMAL LOW (ref 150–400)
RBC: 3.11 MIL/uL — ABNORMAL LOW (ref 4.22–5.81)
RDW: 17.4 % — ABNORMAL HIGH (ref 11.5–15.5)
WBC: 4 K/uL (ref 4.0–10.5)
nRBC: 0 % (ref 0.0–0.2)

## 2023-10-15 DIAGNOSIS — D61818 Other pancytopenia: Secondary | ICD-10-CM | POA: Diagnosis not present

## 2023-10-15 DIAGNOSIS — R1310 Dysphagia, unspecified: Secondary | ICD-10-CM | POA: Diagnosis not present

## 2023-10-16 ENCOUNTER — Other Ambulatory Visit: Payer: Self-pay

## 2023-10-16 DIAGNOSIS — D61818 Other pancytopenia: Secondary | ICD-10-CM

## 2023-10-16 LAB — MYELODYSPLASTIC SYNDROME (MDS), FISH PANEL

## 2023-10-16 NOTE — Progress Notes (Signed)
 Olam from [facility] called requesting that patient have 1 unit of platelets this week per Dr. Jodean.  Platelets on 10/15/23 were 22, but patient has an active bleed. Appointment made and platelets ordered. Patient aware.

## 2023-10-17 ENCOUNTER — Inpatient Hospital Stay: Attending: Hematology

## 2023-10-17 DIAGNOSIS — D61818 Other pancytopenia: Secondary | ICD-10-CM | POA: Diagnosis not present

## 2023-10-17 MED ORDER — ACETAMINOPHEN 325 MG PO TABS
650.0000 mg | ORAL_TABLET | Freq: Once | ORAL | Status: AC
  Administered 2023-10-17: 650 mg via ORAL
  Filled 2023-10-17: qty 2

## 2023-10-17 MED ORDER — DIPHENHYDRAMINE HCL 25 MG PO CAPS
25.0000 mg | ORAL_CAPSULE | Freq: Once | ORAL | Status: AC
  Administered 2023-10-17: 25 mg via ORAL
  Filled 2023-10-17: qty 1

## 2023-10-17 MED ORDER — SODIUM CHLORIDE 0.9% IV SOLUTION
250.0000 mL | INTRAVENOUS | Status: DC
  Administered 2023-10-17: 100 mL via INTRAVENOUS

## 2023-10-17 NOTE — Progress Notes (Signed)
 Patient presents today for 1 unit of platelets per [facility] orders.   1 unit of platelets given today per MD orders. Tolerated infusion without adverse affects. Vital signs stable. No complaints at this time. Discharged from clinic ambulatory in stable condition. Alert and oriented x 3. F/U with [facility] as scheduled.

## 2023-10-17 NOTE — Progress Notes (Signed)
 CRITICAL VALUE ALERT Critical value received:  Platelets= 20 Date of notification:  10/17/2023 Time of notification: 0947 Critical value read back:  Yes.   Nurse who received alert:  Dena Daring, RN MD notified time and response:  Dr. Davonna made aware at [phone].  Patient is receiving 1 unit of platelets today.

## 2023-10-17 NOTE — Patient Instructions (Signed)
 [facility] - A DEPT OF [facility]  Discharge Instructions: Thank you for choosing [facility] to provide your oncology and hematology care.  If you have a lab appointment with the Cancer Center - please note that after April 8th, 2024, all labs will be drawn in the cancer center.  You do not have to check in or register with the main entrance as you have in the past but will complete your check-in in the cancer center.  Wear comfortable clothing and clothing appropriate for easy access to any Portacath or PICC line.   We strive to give you quality time with your provider. You may need to reschedule your appointment if you arrive late (15 or more minutes).  Arriving late affects you and other patients whose appointments are after yours.  Also, if you miss three or more appointments without notifying the office, you may be dismissed from the clinic at the provider's discretion.      For prescription refill requests, have your pharmacy contact our office and allow 72 hours for refills to be completed.    Today you received the following chemotherapy and/or immunotherapy agents blood products. Blood Transfusion, Adult, Care After The following information offers guidance on how to care for yourself after your procedure. Your health care provider may also give you more specific instructions. If you have problems or questions, contact your health care provider. What can I expect after the procedure? After the procedure, it is common to have: Bruising and soreness where the IV was inserted. A headache. Follow these instructions at home: IV insertion site care     Follow instructions from your health care provider about how to take care of your IV insertion site. Make sure you: Wash your hands with soap and water for at least 20 seconds before and after you change your bandage (dressing). If soap and water are not available, use hand sanitizer. Change  your dressing as told by your health care provider. Check your IV insertion site every day for signs of infection. Check for: Redness, swelling, or pain. Bleeding from the site. Warmth. Pus or a bad smell. General instructions Take over-the-counter and prescription medicines only as told by your health care provider. Rest as told by your health care provider. Return to your normal activities as told by your health care provider. Keep all follow-up visits. Lab tests may need to be done at certain periods to recheck your blood counts. Contact a health care provider if: You have itching or red, swollen areas of skin (hives). You have a fever or chills. You have pain in the head, back, or chest. You feel anxious or you feel weak after doing your normal activities. You have redness, swelling, warmth, or pain around the IV insertion site. You have blood coming from the IV insertion site that does not stop with pressure. You have pus or a bad smell coming from your IV insertion site. If you received your blood transfusion in an outpatient setting, you will be told whom to contact to report any reactions. Get help right away if: You have symptoms of a serious allergic or immune system reaction, including: Trouble breathing or shortness of breath. Swelling of the face, feeling flushed, or widespread rash. Dark urine or blood in the urine. Fast heartbeat. These symptoms may be an emergency. Get help right away. Call 911. Do not wait to see if the symptoms will go away. Do not drive yourself  to the hospital. Summary Bruising and soreness around the IV insertion site are common. Check your IV insertion site every day for signs of infection. Rest as told by your health care provider. Return to your normal activities as told by your health care provider. Get help right away for symptoms of a serious allergic or immune system reaction to the blood transfusion. This information is not intended to  replace advice given to you by your health care provider. Make sure you discuss any questions you have with your health care provider. Document Revised: 04/27/2021 Document Reviewed: 04/27/2021 Elsevier Patient Education  2024 Elsevier Inc.      To help prevent nausea and vomiting after your treatment, we encourage you to take your nausea medication as directed.  BELOW ARE SYMPTOMS THAT SHOULD BE REPORTED IMMEDIATELY: *FEVER GREATER THAN 100.4 F (38 C) OR HIGHER *CHILLS OR SWEATING *NAUSEA AND VOMITING THAT IS NOT CONTROLLED WITH YOUR NAUSEA MEDICATION *UNUSUAL SHORTNESS OF BREATH *UNUSUAL BRUISING OR BLEEDING *URINARY PROBLEMS (pain or burning when urinating, or frequent urination) *BOWEL PROBLEMS (unusual diarrhea, constipation, pain near the anus) TENDERNESS IN MOUTH AND THROAT WITH OR WITHOUT PRESENCE OF ULCERS (sore throat, sores in mouth, or a toothache) UNUSUAL RASH, SWELLING OR PAIN  UNUSUAL VAGINAL DISCHARGE OR ITCHING   Items with * indicate a potential emergency and should be followed up as soon as possible or go to the Emergency Department if any problems should occur.  Please show the CHEMOTHERAPY ALERT CARD or IMMUNOTHERAPY ALERT CARD at check-in to the Emergency Department and triage nurse.  Should you have questions after your visit or need to cancel or reschedule your appointment, please contact [facility] [place] - A DEPT OF Eligha Bridegroom [facility] [phone]  and follow the prompts.  Office hours are 8:00 a.m. to 4:30 p.m. Monday - Friday. Please note that voicemails left after 4:00 p.m. may not be returned until the following business day.  We are closed weekends and major holidays. You have access to a nurse at all times for urgent questions. Please call the main number to the clinic [phone] and follow the prompts.  For any non-urgent questions, you may also contact your provider using MyChart. We now offer e-Visits for anyone 62 and older to  request care online for non-urgent symptoms. For details visit [email or web address].   Also download the MyChart app! Go to the app store, search "MyChart", open the app, select [place], and log in with your MyChart username and password.

## 2023-10-18 LAB — PREPARE PLATELET PHERESIS: Unit division: 0

## 2023-10-18 LAB — BPAM PLATELET PHERESIS
Blood Product Expiration Date: 202509072359
ISSUE DATE / TIME: 202509050939
Unit Type and Rh: 6200

## 2023-10-20 DIAGNOSIS — Z299 Encounter for prophylactic measures, unspecified: Secondary | ICD-10-CM | POA: Diagnosis not present

## 2023-10-20 DIAGNOSIS — K224 Dyskinesia of esophagus: Secondary | ICD-10-CM | POA: Diagnosis not present

## 2023-10-20 DIAGNOSIS — J479 Bronchiectasis, uncomplicated: Secondary | ICD-10-CM | POA: Diagnosis not present

## 2023-10-20 LAB — CBC WITH DIFFERENTIAL/PLATELET
Abs Immature Granulocytes: 0.02 K/uL (ref 0.00–0.07)
Basophils Absolute: 0 K/uL (ref 0.0–0.1)
Basophils Relative: 1 %
Eosinophils Absolute: 0.2 K/uL (ref 0.0–0.5)
Eosinophils Relative: 7 %
HCT: 31.2 % — ABNORMAL LOW (ref 39.0–52.0)
Hemoglobin: 10.1 g/dL — ABNORMAL LOW (ref 13.0–17.0)
Immature Granulocytes: 1 %
Lymphocytes Relative: 34 %
Lymphs Abs: 1.1 K/uL (ref 0.7–4.0)
MCH: 33 pg (ref 26.0–34.0)
MCHC: 32.4 g/dL (ref 30.0–36.0)
MCV: 102 fL — ABNORMAL HIGH (ref 80.0–100.0)
Monocytes Absolute: 0.5 K/uL (ref 0.1–1.0)
Monocytes Relative: 14 %
Neutro Abs: 1.5 K/uL — ABNORMAL LOW (ref 1.7–7.7)
Neutrophils Relative %: 43 %
Platelets: 20 K/uL — CL (ref 150–400)
RBC: 3.06 MIL/uL — ABNORMAL LOW (ref 4.22–5.81)
RDW: 17.5 % — ABNORMAL HIGH (ref 11.5–15.5)
WBC: 3.4 K/uL — ABNORMAL LOW (ref 4.0–10.5)
nRBC: 0 % (ref 0.0–0.2)

## 2023-10-22 ENCOUNTER — Inpatient Hospital Stay

## 2023-11-11 ENCOUNTER — Other Ambulatory Visit: Payer: Self-pay

## 2023-11-11 DIAGNOSIS — D61818 Other pancytopenia: Secondary | ICD-10-CM

## 2023-11-12 ENCOUNTER — Inpatient Hospital Stay: Attending: Hematology

## 2023-11-12 DIAGNOSIS — I2510 Atherosclerotic heart disease of native coronary artery without angina pectoris: Secondary | ICD-10-CM | POA: Insufficient documentation

## 2023-11-12 DIAGNOSIS — Z923 Personal history of irradiation: Secondary | ICD-10-CM | POA: Diagnosis not present

## 2023-11-12 DIAGNOSIS — Z79899 Other long term (current) drug therapy: Secondary | ICD-10-CM | POA: Insufficient documentation

## 2023-11-12 DIAGNOSIS — K449 Diaphragmatic hernia without obstruction or gangrene: Secondary | ICD-10-CM | POA: Diagnosis not present

## 2023-11-12 DIAGNOSIS — I700 Atherosclerosis of aorta: Secondary | ICD-10-CM | POA: Insufficient documentation

## 2023-11-12 DIAGNOSIS — Z9221 Personal history of antineoplastic chemotherapy: Secondary | ICD-10-CM | POA: Insufficient documentation

## 2023-11-12 DIAGNOSIS — R1310 Dysphagia, unspecified: Secondary | ICD-10-CM | POA: Diagnosis not present

## 2023-11-12 DIAGNOSIS — D61818 Other pancytopenia: Secondary | ICD-10-CM | POA: Diagnosis present

## 2023-11-12 LAB — CBC WITH DIFFERENTIAL/PLATELET
Abs Immature Granulocytes: 0.01 K/uL (ref 0.00–0.07)
Basophils Absolute: 0 K/uL (ref 0.0–0.1)
Basophils Relative: 0 %
Eosinophils Absolute: 0.3 K/uL (ref 0.0–0.5)
Eosinophils Relative: 5 %
HCT: 31.4 % — ABNORMAL LOW (ref 39.0–52.0)
Hemoglobin: 10.2 g/dL — ABNORMAL LOW (ref 13.0–17.0)
Immature Granulocytes: 0 %
Lymphocytes Relative: 28 %
Lymphs Abs: 1.4 K/uL (ref 0.7–4.0)
MCH: 33.3 pg (ref 26.0–34.0)
MCHC: 32.5 g/dL (ref 30.0–36.0)
MCV: 102.6 fL — ABNORMAL HIGH (ref 80.0–100.0)
Monocytes Absolute: 0.5 K/uL (ref 0.1–1.0)
Monocytes Relative: 10 %
Neutro Abs: 2.8 K/uL (ref 1.7–7.7)
Neutrophils Relative %: 57 %
Platelets: 15 K/uL — CL (ref 150–400)
RBC: 3.06 MIL/uL — ABNORMAL LOW (ref 4.22–5.81)
RDW: 16.1 % — ABNORMAL HIGH (ref 11.5–15.5)
WBC: 4.9 K/uL (ref 4.0–10.5)
nRBC: 0 % (ref 0.0–0.2)
nRBC: 0 /100{WBCs}

## 2023-11-12 LAB — COMPREHENSIVE METABOLIC PANEL WITH GFR
ALT: 12 U/L (ref 0–44)
AST: 18 U/L (ref 15–41)
Albumin: 4.1 g/dL (ref 3.5–5.0)
Alkaline Phosphatase: 86 U/L (ref 38–126)
Anion gap: 9 (ref 5–15)
BUN: 9 mg/dL (ref 8–23)
CO2: 24 mmol/L (ref 22–32)
Calcium: 9.7 mg/dL (ref 8.9–10.3)
Chloride: 104 mmol/L (ref 98–111)
Creatinine, Ser: 1.06 mg/dL (ref 0.61–1.24)
GFR, Estimated: >60 mL/min (ref >60)
Glucose, Bld: 84 mg/dL (ref 70–99)
Potassium: 4.6 mmol/L (ref 3.5–5.1)
Sodium: 138 mmol/L (ref 135–145)
Total Bilirubin: 0.5 mg/dL (ref 0.0–1.2)
Total Protein: 7 g/dL (ref 6.5–8.1)

## 2023-11-12 LAB — LACTATE DEHYDROGENASE: LDH: 176 U/L (ref 98–192)

## 2023-11-12 LAB — SAMPLE TO BLOOD BANK

## 2023-11-12 NOTE — Progress Notes (Unsigned)
 Lab called with platelets of 15, Dr Mickiel Dry notified, advised that pt would be seen in clinic in am, no orders given,

## 2023-11-13 NOTE — Progress Notes (Unsigned)
 Verbal order from Dr. Davonna to give one unit of platelets tomorrow for platelet count of 15,000.   Called the son to let him know of appt for infusion and follow up appts for next week.

## 2023-11-14 ENCOUNTER — Inpatient Hospital Stay

## 2023-11-14 ENCOUNTER — Other Ambulatory Visit: Payer: Self-pay | Admitting: Oncology

## 2023-11-14 VITALS — BP 139/56 | HR 61 | Temp 96.6°F | Resp 18 | Wt 121.4 lb

## 2023-11-14 DIAGNOSIS — D61818 Other pancytopenia: Secondary | ICD-10-CM

## 2023-11-14 MED ORDER — ACETAMINOPHEN 325 MG PO TABS
650.0000 mg | ORAL_TABLET | Freq: Once | ORAL | Status: AC
  Administered 2023-11-14: 650 mg via ORAL
  Filled 2023-11-14: qty 2

## 2023-11-14 MED ORDER — DIPHENHYDRAMINE HCL 25 MG PO CAPS
25.0000 mg | ORAL_CAPSULE | Freq: Once | ORAL | Status: AC
  Administered 2023-11-14: 25 mg via ORAL
  Filled 2023-11-14: qty 1

## 2023-11-14 MED ORDER — SODIUM CHLORIDE 0.9% IV SOLUTION
250.0000 mL | INTRAVENOUS | Status: DC
  Administered 2023-11-14: 250 mL via INTRAVENOUS

## 2023-11-14 NOTE — Patient Instructions (Signed)
 [facility] [place] - A DEPT OF [facility]  Discharge Instructions: Thank you for choosing [facility] to provide your oncology and hematology care.  If you have a lab appointment with the Cancer Center - please note that after April 8th, 2024, all labs will be drawn in the cancer center.  You do not have to check in or register with the main entrance as you have in the past but will complete your check-in in the cancer center.  Wear comfortable clothing and clothing appropriate for easy access to any Portacath or PICC line.   We strive to give you quality time with your provider. You may need to reschedule your appointment if you arrive late (15 or more minutes).  Arriving late affects you and other patients whose appointments are after yours.  Also, if you miss three or more appointments without notifying the office, you may be dismissed from the clinic at the provider's discretion.      For prescription refill requests, have your pharmacy contact our office and allow 72 hours for refills to be completed.    Today you received 1 unit of platelets.     BELOW ARE SYMPTOMS THAT SHOULD BE REPORTED IMMEDIATELY: *FEVER GREATER THAN 100.4 F (38 C) OR HIGHER *CHILLS OR SWEATING *NAUSEA AND VOMITING THAT IS NOT CONTROLLED WITH YOUR NAUSEA MEDICATION *UNUSUAL SHORTNESS OF BREATH *UNUSUAL BRUISING OR BLEEDING *URINARY PROBLEMS (pain or burning when urinating, or frequent urination) *BOWEL PROBLEMS (unusual diarrhea, constipation, pain near the anus) TENDERNESS IN MOUTH AND THROAT WITH OR WITHOUT PRESENCE OF ULCERS (sore throat, sores in mouth, or a toothache) UNUSUAL RASH, SWELLING OR PAIN  UNUSUAL VAGINAL DISCHARGE OR ITCHING   Items with * indicate a potential emergency and should be followed up as soon as possible or go to the Emergency Department if any problems should occur.  Please show the CHEMOTHERAPY ALERT CARD or IMMUNOTHERAPY ALERT CARD at  check-in to the Emergency Department and triage nurse.  Should you have questions after your visit or need to cancel or reschedule your appointment, please contact [facility] [place] - A DEPT OF [facility] [phone]  and follow the prompts.  Office hours are 8:00 a.m. to 4:30 p.m. Monday - Friday. Please note that voicemails left after 4:00 p.m. may not be returned until the following business day.  We are closed weekends and major holidays. You have access to a nurse at all times for urgent questions. Please call the main number to the clinic [phone] and follow the prompts.  For any non-urgent questions, you may also contact your provider using MyChart. We now offer e-Visits for anyone 21 and older to request care online for non-urgent symptoms. For details visit [email or web address].   Also download the MyChart app! Go to the app store, search MyChart, open the app, select [place], and log in with your MyChart username and password.

## 2023-11-14 NOTE — Progress Notes (Signed)
 Patient presents today for 1 unit of platelets per provider's order. Vital signs stable and patient voiced no new complaints at this time.  Peripheral IV started with good blood return pre and post infusion.   Treatment given today per MD orders. Tolerated infusion without adverse affects. Vital signs stable. No complaints at this time. Discharged from clinic ambulatory in stable condition. Alert and oriented x 3. F/U with [facility] as scheduled.

## 2023-11-15 LAB — PREPARE PLATELET PHERESIS: Unit division: 0

## 2023-11-15 LAB — BPAM PLATELET PHERESIS
Blood Product Expiration Date: 202510052359
ISSUE DATE / TIME: 202510030927
Unit Type and Rh: 6200

## 2023-11-18 ENCOUNTER — Other Ambulatory Visit: Payer: Self-pay

## 2023-11-18 DIAGNOSIS — D61818 Other pancytopenia: Secondary | ICD-10-CM

## 2023-11-18 NOTE — Progress Notes (Unsigned)
 Patient Care Team: Rosamond Leta NOVAK, MD as PCP - General (Internal Medicine)  Clinic Day:  11/18/2023  Referring physician: Rosamond Leta NOVAK, MD   CHIEF COMPLAINT:  CC: Severe pancytopenia   Ian Ryan 78 y.o. male was transferred to my care after his prior physician has left.   ASSESSMENT & PLAN:   Assessment & Plan: Ian Ryan  is a 78 y.o. male with anemia and thrombocytopenia  Assessment and Plan Assessment & Plan Pancytopenia (anemia, thrombocytopenia, leukopenia) of unclear etiology History of thrombocytopenia for several years with platelet count ranging between 40-65 since 22.  Had a bone marrow biopsy done in [place] in 2023 that showed increased erythropoiesis.   Bone marrow biopsy here not consistent with MDS.  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 -With the recent decrease in white blood cell count, I think bone marrow biopsy is reasonable and might pivot us  in a direction of treatment at this time. -Patient has no B symptoms - Will continue transfusion support with labs every 2 weeks.  Will transfuse for hemoglobin less than 7 and platelets less than 20. - Patient wishes to go to [place] in the third week of October.  Recommended to get a bone marrow biopsy prior to that.  Will schedule follow-up based on bone marrow biopsy results.  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  Subsequent PET in July 2024 in March 2025 did not show any active disease  Patient reports difficulty swallowing but has not changed from baseline. Patient is planning to go to [place] this month.  If he does not do a follow-up PET scan there, will consider obtaining it here next year.  Swallowing difficulty Being evaluated by speech  pathology at [facility]   The patient understands the plans discussed today and is in agreement with them.  He knows to contact our office if he develops concerns prior to his next appointment.  40 minutes of total time was spent for this patient encounter, including preparation,review of records,  face-to-face counseling with the patient and coordination of care, physical exam, and documentation of the encounter.    Ian Ryan,acting as a Neurosurgeon for Mickiel Dry, MD.,have documented all relevant documentation on the behalf of Mickiel Dry, MD,as directed by  Mickiel Dry, MD while in the presence of Mickiel Dry, MD.  I, Mickiel Dry MD, have reviewed the above documentation for accuracy and completeness, and I agree with the above.     Mickiel Dry, MD  [facility] [place] - A DEPT OF [facility] [address] [place] [address] Dept: [phone] Dept Fax: [phone]   No orders of the defined types were placed in this encounter.    ONCOLOGY HISTORY:   I have reviewed his chart and materials related to his cancer extensively and collaborated history with the patient. Summary of oncologic history is as follows:   Diagnosis: Severe pancytopenia   -Patient has a history of thrombocytopenia since 2022 with platelets ranging between 40-65 K -06/05/2021: Bone marrow biopsy done in [place]: Megakaryocytes are reduced. Increased erythropoiesis. Granulopoiesis is adequate. Reticulin stain is negative (as per documentation) -08/05/2023: CBC diff: WBC-3.2 with ANC 1.3, RBC-2.46, Hb-8.4, MCV-108, PLT-  8<.  -08/13/2023: CBC diff: RBC-2.39, Hb-8.7, MCV-107.5, PLT-9, smear review showed unremarkable WBC and RBC morphology. -08/13/2023: Peripheral blood flow cytometry found no abnormal B-cell or T-cell population. SPEP and immunofixation normal. LDH normal.  -08/19/2023: Bone Marrow Biopsy.   Pathology: Cellular bone marrow with erythroid predominant trilineage hematopoiesis and mild dyspoiesis (<10%). Blasts are not increased.   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 Left level 2 and 3 lymph nodes present.  -Patient is s/p chemoradiation therapy with Nimotuzumab (EGFR antibody) and carboplatin -08/2022-current: Patient under surveillance with imaging showing NED.  As per documentation PET scan from July 2024 and March 2025 did not show any active disease  Current Treatment: Transfusion support  INTERVAL HISTORY:   Discussed the use of AI scribe software for clinical note transcription with the patient, who gave verbal consent to proceed.  History of Present Illness Ian Ryan is a 78 year old male with anemia and thrombocytopenia who presents for evaluation of persistent low blood counts. He is accompanied by his son.   He has been receiving regular platelet and blood transfusions due to low platelet and hemoglobin levels. Despite these treatments, the underlying cause of his condition remains undiagnosed. Previous bone marrow biopsies, including one conducted in [place] in 2023, have not revealed any definitive cause. A repeat biopsy is planned at [place], but it has not yet been scheduled.  He has a history of anemia and thrombocytopenia, with low hemoglobin and platelet counts. His white blood cell count has also decreased recently. No symptoms such as bleeding, epistaxis, fever, or chills. No significant fatigue, as he is able to work all day without feeling tired.  He has a  history of difficulty swallowing and has had previous head and neck cancer treatment with chemo radiation.  He spends six [place] in the [place]  and six months in [place], with plans to return to [place] in the coming months. He lives in [place] with his wife and son.  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   I have reviewed the past medical history, past surgical history, social history and family history with the patient and they are unchanged from previous note.  ALLERGIES:  has no known allergies.  MEDICATIONS:  Current Outpatient Medications  Medication Sig Dispense Refill   bimatoprost (LUMIGAN) 0.03 % ophthalmic solution 1 drop at bedtime.     misoprostol (CYTOTEC) 100 MCG tablet Take 100 mcg by mouth 4 (four) times daily.     telmisartan (MICARDIS) 20 MG tablet Take 25 mg by mouth daily.     traZODone  (DESYREL ) 50 MG tablet Take 1 tablet (50 mg total) by mouth at bedtime. 30 tablet 2   No current facility-administered medications for this visit.    REVIEW OF SYSTEMS:   Constitutional: Denies fevers, chills or abnormal weight loss Eyes: Denies blurriness of vision Ears, nose, mouth, throat, and face: Denies mucositis or sore throat Respiratory: Denies cough, dyspnea or wheezes Cardiovascular: Denies palpitation, chest discomfort or lower extremity swelling Gastrointestinal:  Denies nausea, heartburn or change in bowel habits Skin: Denies abnormal skin rashes Lymphatics: Denies new lymphadenopathy or easy bruising Neurological:Denies numbness, tingling or new weaknesses Behavioral/Psych: Mood is stable, no new changes  All other systems were reviewed with the patient and are negative.   VITALS:  There were no vitals taken for this visit.  Wt Readings from Last 3 Encounters:  11/14/23 121 lb 6.4 oz (55.1 kg)  09/10/23 121 lb 3.2 oz (55 kg)  09/01/23 125 lb 12.8 oz (57.1 kg)    There is no height or weight on file to calculate BMI.  Performance status (ECOG): 1 -  Symptomatic but completely ambulatory  PHYSICAL EXAM:   GENERAL:alert, no distress and comfortable SKIN: skin color, texture, turgor are normal, no rashes or significant lesions LYMPH:  no palpable lymphadenopathy in the cervical, axillary or inguinal LUNGS: clear to auscultation and percussion with normal breathing effort HEART: regular rate & rhythm and no murmurs and no lower extremity edema ABDOMEN:abdomen soft, non-tender and normal bowel sounds Musculoskeletal:no cyanosis of digits and no clubbing  NEURO: alert & oriented x 3 with fluent speech, no focal motor/sensory deficits  LABORATORY DATA:  I have reviewed the data as listed   Lab Results  Component Value Date   WBC 4.9 11/12/2023   NEUTROABS 2.8 11/12/2023   HGB 10.2 (L) 11/12/2023   HCT 31.4 (L) 11/12/2023   MCV 102.6 (H) 11/12/2023   PLT 15 (LL) 11/12/2023     Chemistry      Component Value Date/Time   NA 138 11/12/2023 1431   K 4.6 11/12/2023 1431   CL 104 11/12/2023 1431   CO2 24 11/12/2023 1431   BUN 9 11/12/2023 1431   CREATININE 1.06 11/12/2023 1431      Component Value Date/Time   CALCIUM 9.7 11/12/2023 1431   ALKPHOS 86 11/12/2023 1431   AST 18 11/12/2023 1431   ALT 12 11/12/2023 1431   BILITOT 0.5 11/12/2023 1431       RADIOGRAPHIC STUDIES: I have personally reviewed the radiological images as listed and agreed with the findings in the report.  CT CHEST WO CONTRAST CLINICAL DATA:  Cough following remote chemotherapy and radiation therapy  EXAM: CT CHEST WITHOUT CONTRAST  TECHNIQUE: Multidetector CT imaging of the chest was performed following the standard protocol without IV contrast.  RADIATION DOSE REDUCTION: This exam was performed according to the departmental dose-optimization program which includes automated exposure control, adjustment of the mA and/or kV according to patient size and/or use of iterative reconstruction technique.  COMPARISON:  None  Available.  FINDINGS: Cardiovascular: Mild coronary artery calcification. Global cardiac size iswithin normal limits. No pericardial effusion. Central pulmonary arteries are of normal caliber. Moderate atherosclerotic calcification within the thoracic aorta. No aortic aneurysm.  Mediastinum/Nodes: Densely calcified left prevascular lymph node keeping with changes of old granulomatous disease. No pathologic thoracic adenopathy. The esophagus is patulous which may reflect changes of esophageal dysmotility or gastroesophageal reflux. Small hiatal hernia. Visualized thyroid  is.  Lungs/Pleura: There is extensive cylindrical and cystic bronchiectasis within the left lower lobe marked architectural distortion volume loss involving the posterior basal segment and anteromedial segments likely reflecting the sequela prior infection or aspiration. There is bronchial wall thickening, airway impaction, and tree-in-bud peribronchial nodularity within the remainder of the left lower lobe in keeping with airway inflammation and infectious or inflammatory bronchiolitis. 4 mm spiculated nodule within the right lower lobe (64/8) indeterminate. No pneumothorax or pleural effusion. No central obstructing lesion.  Upper Abdomen: No acute abnormality.  Musculoskeletal: No chest wall mass or suspicious bone lesions identified.  IMPRESSION: 1. Extensive cylindrical and cystic bronchiectasis within the left lower lobe with marked architectural distortion and volume loss  involving the posterior basal segment and anteromedial segments likely reflecting the sequela prior infection or aspiration. 2. Bronchial wall thickening, airway impaction, and tree-in-bud peribronchial nodularity within the remainder of the left lower lobe in keeping with airway inflammation and infectious or inflammatory bronchiolitis. 3. 4 mm spiculated nodule within the right lower lobe. No follow-up needed if patient is  low-risk.This recommendation follows the consensus statement: Guidelines for Management of Incidental Pulmonary Nodules Detected on CT Images: From the Fleischner Society 2017; Radiology 2017; 284:228-243. 4.  Mild coronary artery calcification. 5. Patulous esophagus which may reflect changes of esophageal dysmotility or gastroesophageal reflux. Small hiatal hernia. 6. Aortic atherosclerosis.  Aortic Atherosclerosis (ICD10-I70.0).  Electronically Signed   By: Dorethia Molt M.D.   On: 10/12/2023 03:14

## 2023-11-18 NOTE — Progress Notes (Incomplete)
 Patient Care Team: Rosamond Leta NOVAK, MD as PCP - General (Internal Medicine)  Clinic Day:  11/18/2023  Referring physician: Rosamond Leta NOVAK, MD   CHIEF COMPLAINT:  CC: Severe pancytopenia   Ian Ryan 78 y.o. male was transferred to my care after his prior physician has left.   ASSESSMENT & PLAN:   Assessment & Plan: Ian Ryan  is a 78 y.o. male with ***  Assessment & Plan     The patient understands the plans discussed today and is in agreement with them.  He knows to contact our office if he develops concerns prior to his next appointment.  *** minutes of total time was spent for this patient encounter, including preparation,review of records,  face-to-face counseling with the patient and coordination of care, physical exam, and documentation of the encounter.    [name],acting as a Neurosurgeon for Mickiel Dry, MD.,have documented all relevant documentation on the behalf of Mickiel Dry, MD,as directed by  Mickiel Dry, MD while in the presence of Mickiel Dry, MD.  ***   Verneta SAUNDERS Ege  [facility] [place] - A DEPT OF [facility] [address] [place] [address] Dept: [phone] Dept Fax: [phone]   No orders of the defined types were placed in this encounter.    ONCOLOGY HISTORY:   I have reviewed his chart and materials related to his cancer extensively and collaborated history with the patient. Summary of oncologic history is as follows:   Diagnosis: Severe pancytopenia   -Patient has a history of thrombocytopenia since 2022 with platelets ranging between 40-65 K -08/05/2023: CBC diff: WBC-3.2 with ANC 1.3, RBC-2.46, Hb-8.4, MCV-108, PLT- 8<.  -08/13/2023: CBC diff: RBC-2.39, Hb-8.7, MCV-107.5, PLT-9, smear review showed unremarkable WBC and RBC morphology. -08/13/2023: Peripheral blood flow cytometry found no abnormal B-cell or T-cell population. SPEP and  immunofixation normal. LDH normal.  -08/19/2023: Bone Marrow Biopsy.  Pathology: Cellular bone marrow with erythroid predominant trilineage hematopoiesis and mild dyspoiesis (<10%). Blasts are not increased.   Chromosome Analysis: 46,X,-Y[4]/46,XY[16] -MDS FISH Panel: Normal -NGS myeloid panel: *** -PNH panel: negative -09/10/2023: Serum EPO: 167.1 (elevated)  Diagnosis: Head and neck cancer (left pyriform fossa)   -03/2022: Initial Diagnosis in [place] -04/03/2022: PET: Left pyriform fossa and AE fold mass lesion infiltrating epiglottis. Left level 2 and 3 lymph nodes present.  -Patient is s/p chemoradiation therapy with Nimotuzumab (EGFR antibody) and carboplatin -08/2022-current: Patient under surveillance with imaging showing NED  Current Treatment:  ***  INTERVAL HISTORY:   Ian Ryan is here today for follow up and to establish care with me for ***. Patient is accompanied by *** .     I have reviewed the past medical history, past surgical history, social history and family history with the patient and they are unchanged from previous note.  ALLERGIES:  has no known allergies.  MEDICATIONS:  Current Outpatient Medications  Medication Sig Dispense Refill   bimatoprost (LUMIGAN) 0.03 % ophthalmic solution 1 drop at bedtime.     misoprostol (CYTOTEC) 100 MCG tablet Take 100 mcg by mouth 4 (four) times daily.     telmisartan (MICARDIS) 20 MG tablet Take 25 mg by mouth daily.     traZODone  (DESYREL ) 50 MG tablet Take 1 tablet (50 mg total) by mouth at bedtime. 30 tablet 2   No current facility-administered medications for this visit.    REVIEW OF SYSTEMS:   Constitutional: Denies fevers, chills or abnormal weight  loss Eyes: Denies blurriness of vision Ears, nose, mouth, throat, and face: Denies mucositis or sore throat Respiratory: Denies cough, dyspnea or wheezes Cardiovascular: Denies palpitation, chest discomfort or lower extremity swelling Gastrointestinal:   Denies nausea, heartburn or change in bowel habits Skin: Denies abnormal skin rashes Lymphatics: Denies new lymphadenopathy or easy bruising Neurological:Denies numbness, tingling or new weaknesses Behavioral/Psych: Mood is stable, no new changes  All other systems were reviewed with the patient and are negative.   VITALS:  There were no vitals taken for this visit.  Wt Readings from Last 3 Encounters:  11/14/23 121 lb 6.4 oz (55.1 kg)  09/10/23 121 lb 3.2 oz (55 kg)  09/01/23 125 lb 12.8 oz (57.1 kg)    There is no height or weight on file to calculate BMI.  Performance status (ECOG): {CHL ONC [id number]  PHYSICAL EXAM:   GENERAL:alert, no distress and comfortable SKIN: skin color, texture, turgor are normal, no rashes or significant lesions EYES: normal, Conjunctiva are pink and non-injected, sclera clear OROPHARYNX:no exudate, no erythema and lips, buccal mucosa, and tongue normal  NECK: supple, thyroid  normal size, non-tender, without nodularity LYMPH:  no palpable lymphadenopathy in the cervical, axillary or inguinal LUNGS: clear to auscultation and percussion with normal breathing effort HEART: regular rate & rhythm and no murmurs and no lower extremity edema ABDOMEN:abdomen soft, non-tender and normal bowel sounds Musculoskeletal:no cyanosis of digits and no clubbing  NEURO: alert & oriented x 3 with fluent speech, no focal motor/sensory deficits  LABORATORY DATA:  I have reviewed the data as listed  No results found for: SPEP, UPEP  Lab Results  Component Value Date   WBC 4.9 11/12/2023   NEUTROABS 2.8 11/12/2023   HGB 10.2 (L) 11/12/2023   HCT 31.4 (L) 11/12/2023   MCV 102.6 (H) 11/12/2023   PLT 15 (LL) 11/12/2023    @LASTCHEMISTRY @   RADIOGRAPHIC STUDIES: I have personally reviewed the radiological images as listed and agreed with the findings in the report. CT CHEST WO CONTRAST CLINICAL DATA:  Cough following remote chemotherapy and  radiation therapy  EXAM: CT CHEST WITHOUT CONTRAST  TECHNIQUE: Multidetector CT imaging of the chest was performed following the standard protocol without IV contrast.  RADIATION DOSE REDUCTION: This exam was performed according to the departmental dose-optimization program which includes automated exposure control, adjustment of the mA and/or kV according to patient size and/or use of iterative reconstruction technique.  COMPARISON:  None Available.  FINDINGS: Cardiovascular: Mild coronary artery calcification. Global cardiac size iswithin normal limits. No pericardial effusion. Central pulmonary arteries are of normal caliber. Moderate atherosclerotic calcification within the thoracic aorta. No aortic aneurysm.  Mediastinum/Nodes: Densely calcified left prevascular lymph node keeping with changes of old granulomatous disease. No pathologic thoracic adenopathy. The esophagus is patulous which may reflect changes of esophageal dysmotility or gastroesophageal reflux. Small hiatal hernia. Visualized thyroid  is.  Lungs/Pleura: There is extensive cylindrical and cystic bronchiectasis within the left lower lobe marked architectural distortion volume loss involving the posterior basal segment and anteromedial segments likely reflecting the sequela prior infection or aspiration. There is bronchial wall thickening, airway impaction, and tree-in-bud peribronchial nodularity within the remainder of the left lower lobe in keeping with airway inflammation and infectious or inflammatory bronchiolitis. 4 mm spiculated nodule within the right lower lobe (64/8) indeterminate. No pneumothorax or pleural effusion. No central obstructing lesion.  Upper Abdomen: No acute abnormality.  Musculoskeletal: No chest wall mass or suspicious bone lesions identified.  IMPRESSION: 1. Extensive cylindrical and cystic bronchiectasis  within the left lower lobe with marked architectural distortion and  volume loss involving the posterior basal segment and anteromedial segments likely reflecting the sequela prior infection or aspiration. 2. Bronchial wall thickening, airway impaction, and tree-in-bud peribronchial nodularity within the remainder of the left lower lobe in keeping with airway inflammation and infectious or inflammatory bronchiolitis. 3. 4 mm spiculated nodule within the right lower lobe. No follow-up needed if patient is low-risk.This recommendation follows the consensus statement: Guidelines for Management of Incidental Pulmonary Nodules Detected on CT Images: From the Fleischner Society 2017; Radiology 2017; 284:228-243. 4.  Mild coronary artery calcification. 5. Patulous esophagus which may reflect changes of esophageal dysmotility or gastroesophageal reflux. Small hiatal hernia. 6. Aortic atherosclerosis.  Aortic Atherosclerosis (ICD10-I70.0).  Electronically Signed   By: Dorethia Molt M.D.   On: 10/12/2023 03:14

## 2023-11-19 ENCOUNTER — Encounter: Payer: Self-pay | Admitting: Oncology

## 2023-11-19 ENCOUNTER — Other Ambulatory Visit

## 2023-11-19 ENCOUNTER — Inpatient Hospital Stay: Admitting: Oncology

## 2023-11-19 ENCOUNTER — Inpatient Hospital Stay (HOSPITAL_BASED_OUTPATIENT_CLINIC_OR_DEPARTMENT_OTHER): Admitting: Oncology

## 2023-11-19 ENCOUNTER — Inpatient Hospital Stay

## 2023-11-19 VITALS — BP 150/71 | HR 64 | Temp 97.5°F | Resp 18 | Wt 123.0 lb

## 2023-11-19 DIAGNOSIS — D61818 Other pancytopenia: Secondary | ICD-10-CM | POA: Diagnosis not present

## 2023-11-19 LAB — CBC WITH DIFFERENTIAL/PLATELET
Abs Immature Granulocytes: 0.02 K/uL (ref 0.00–0.07)
Basophils Absolute: 0 K/uL (ref 0.0–0.1)
Basophils Relative: 0 %
Eosinophils Absolute: 0.2 K/uL (ref 0.0–0.5)
Eosinophils Relative: 8 %
HCT: 32.6 % — ABNORMAL LOW (ref 39.0–52.0)
Hemoglobin: 10.6 g/dL — ABNORMAL LOW (ref 13.0–17.0)
Immature Granulocytes: 1 %
Lymphocytes Relative: 35 %
Lymphs Abs: 1.1 K/uL (ref 0.7–4.0)
MCH: 33.4 pg (ref 26.0–34.0)
MCHC: 32.5 g/dL (ref 30.0–36.0)
MCV: 102.8 fL — ABNORMAL HIGH (ref 80.0–100.0)
Monocytes Absolute: 0.3 K/uL (ref 0.1–1.0)
Monocytes Relative: 11 %
Neutro Abs: 1.4 K/uL — ABNORMAL LOW (ref 1.7–7.7)
Neutrophils Relative %: 45 %
Platelets: 34 K/uL — ABNORMAL LOW (ref 150–400)
RBC: 3.17 MIL/uL — ABNORMAL LOW (ref 4.22–5.81)
RDW: 15.7 % — ABNORMAL HIGH (ref 11.5–15.5)
WBC: 3 K/uL — ABNORMAL LOW (ref 4.0–10.5)
nRBC: 0 % (ref 0.0–0.2)

## 2023-11-19 LAB — SAMPLE TO BLOOD BANK

## 2023-11-19 NOTE — Patient Instructions (Signed)
 [facility] at [facility] Discharge Instructions   You were seen and examined today by Dr. Davonna.  She reviewed the results of your lab work which are normal/stable.   Return as scheduled.    Thank you for choosing [facility] at [facility] to provide your oncology and hematology care.  To afford each patient quality time with our provider, please arrive at least 15 minutes before your scheduled appointment time.   If you have a lab appointment with the Cancer Center please come in thru the Main Entrance and check in at the main information desk.  You need to re-schedule your appointment should you arrive 10 or more minutes late.  We strive to give you quality time with our providers, and arriving late affects you and other patients whose appointments are after yours.  Also, if you no show three or more times for appointments you may be dismissed from the clinic at the providers discretion.     Again, thank you for choosing [facility].  Our hope is that these requests will decrease the amount of time that you wait before being seen by our physicians.       _____________________________________________________________  Should you have questions after your visit to [facility], please contact our office at [phone] and follow the prompts.  Our office hours are 8:00 a.m. and 4:30 p.m. Monday - Friday.  Please note that voicemails left after 4:00 p.m. may not be returned until the following business day.  We are closed weekends and major holidays.  You do have access to a nurse 24-7, just call the main number to the clinic [phone] and do not press any options, hold on the line and a nurse will answer the phone.    For prescription refill requests, have your pharmacy contact our office and allow 72 hours.    Due to Covid, you will need to wear a mask upon entering the hospital. If you do not have a mask, a mask  will be given to you at the Main Entrance upon arrival. For doctor visits, patients may have 1 support person age 39 or older with them. For treatment visits, patients can not have anyone with them due to social distancing guidelines and our immunocompromised population.

## 2023-11-28 DIAGNOSIS — D61818 Other pancytopenia: Secondary | ICD-10-CM | POA: Diagnosis not present

## 2023-12-02 ENCOUNTER — Other Ambulatory Visit: Payer: Self-pay

## 2023-12-02 DIAGNOSIS — D61818 Other pancytopenia: Secondary | ICD-10-CM

## 2023-12-03 ENCOUNTER — Inpatient Hospital Stay

## 2023-12-03 ENCOUNTER — Other Ambulatory Visit

## 2023-12-03 ENCOUNTER — Inpatient Hospital Stay: Admitting: Oncology

## 2023-12-03 DIAGNOSIS — D61818 Other pancytopenia: Secondary | ICD-10-CM

## 2023-12-03 LAB — CBC WITH DIFFERENTIAL/PLATELET
Abs Immature Granulocytes: 0.01 K/uL (ref 0.00–0.07)
Basophils Absolute: 0 K/uL (ref 0.0–0.1)
Basophils Relative: 0 %
Eosinophils Absolute: 0.2 K/uL (ref 0.0–0.5)
Eosinophils Relative: 5 %
HCT: 30.8 % — ABNORMAL LOW (ref 39.0–52.0)
Hemoglobin: 9.9 g/dL — ABNORMAL LOW (ref 13.0–17.0)
Immature Granulocytes: 0 %
Lymphocytes Relative: 38 %
Lymphs Abs: 1.5 K/uL (ref 0.7–4.0)
MCH: 33.1 pg (ref 26.0–34.0)
MCHC: 32.1 g/dL (ref 30.0–36.0)
MCV: 103 fL — ABNORMAL HIGH (ref 80.0–100.0)
Monocytes Absolute: 0.5 K/uL (ref 0.1–1.0)
Monocytes Relative: 12 %
Neutro Abs: 1.8 K/uL (ref 1.7–7.7)
Neutrophils Relative %: 45 %
Platelets: 13 K/uL — CL (ref 150–400)
RBC: 2.99 MIL/uL — ABNORMAL LOW (ref 4.22–5.81)
RDW: 14.9 % (ref 11.5–15.5)
WBC: 4 K/uL (ref 4.0–10.5)
nRBC: 0 % (ref 0.0–0.2)

## 2023-12-03 LAB — SAMPLE TO BLOOD BANK

## 2023-12-03 NOTE — Progress Notes (Signed)
 CRITICAL VALUE ALERT Critical value received:  platelets 13,000. Date of notification:  12-03-2023 Time of notification: 09:41 am. Critical value read back:  Yes.   Nurse who received alert:  B.Jahiem Franzoni Rn.  MD notified time and response:  Dr. Davonna @ 09:46 am. Standing orders followed. No platelets needed per standing orders.

## 2023-12-04 ENCOUNTER — Encounter: Payer: Self-pay | Admitting: Oncology

## 2023-12-09 ENCOUNTER — Other Ambulatory Visit: Payer: Self-pay

## 2023-12-09 DIAGNOSIS — D61818 Other pancytopenia: Secondary | ICD-10-CM

## 2023-12-10 ENCOUNTER — Inpatient Hospital Stay: Admitting: Oncology

## 2023-12-10 DIAGNOSIS — D61818 Other pancytopenia: Secondary | ICD-10-CM | POA: Diagnosis not present

## 2023-12-10 DIAGNOSIS — R1310 Dysphagia, unspecified: Secondary | ICD-10-CM | POA: Diagnosis not present

## 2023-12-10 LAB — SAMPLE TO BLOOD BANK

## 2023-12-10 NOTE — Progress Notes (Signed)
 CRITICAL VALUE ALERT Critical value received:  platelets 11,000 Date of notification:  12-10-23 Time of notification: 0950 Critical value read back:  Yes.   Nurse who received alert:  C. Graciemae Delisle RN MD notified time and response:  [phone] Dr. Davonna Will give one unit of platelets tomorrow, spoke with pt son.

## 2023-12-11 ENCOUNTER — Inpatient Hospital Stay

## 2023-12-11 DIAGNOSIS — D61818 Other pancytopenia: Secondary | ICD-10-CM

## 2023-12-11 LAB — CBC WITH DIFFERENTIAL/PLATELET
Abs Immature Granulocytes: 0.01 K/uL (ref 0.00–0.07)
Basophils Absolute: 0 K/uL (ref 0.0–0.1)
Basophils Relative: 1 %
Eosinophils Absolute: 0.2 K/uL (ref 0.0–0.5)
Eosinophils Relative: 5 %
HCT: 30.2 % — ABNORMAL LOW (ref 39.0–52.0)
Hemoglobin: 9.8 g/dL — ABNORMAL LOW (ref 13.0–17.0)
Immature Granulocytes: 0 %
Lymphocytes Relative: 54 %
Lymphs Abs: 2 K/uL (ref 0.7–4.0)
MCH: 33.6 pg (ref 26.0–34.0)
MCHC: 32.5 g/dL (ref 30.0–36.0)
MCV: 103.4 fL — ABNORMAL HIGH (ref 80.0–100.0)
Monocytes Absolute: 0.4 K/uL (ref 0.1–1.0)
Monocytes Relative: 11 %
Neutro Abs: 1 K/uL — ABNORMAL LOW (ref 1.7–7.7)
Neutrophils Relative %: 29 %
Platelets: 11 K/uL — CL (ref 150–400)
RBC: 2.92 MIL/uL — ABNORMAL LOW (ref 4.22–5.81)
RDW: 14.9 % (ref 11.5–15.5)
WBC: 3.6 K/uL — ABNORMAL LOW (ref 4.0–10.5)
nRBC: 0 % (ref 0.0–0.2)

## 2023-12-11 MED ORDER — SODIUM CHLORIDE 0.9% IV SOLUTION
250.0000 mL | INTRAVENOUS | Status: DC
  Administered 2023-12-11: 100 mL via INTRAVENOUS

## 2023-12-11 MED ORDER — DIPHENHYDRAMINE HCL 25 MG PO CAPS
25.0000 mg | ORAL_CAPSULE | Freq: Once | ORAL | Status: AC
  Administered 2023-12-11: 25 mg via ORAL
  Filled 2023-12-11: qty 1

## 2023-12-11 MED ORDER — ACETAMINOPHEN 325 MG PO TABS
650.0000 mg | ORAL_TABLET | Freq: Once | ORAL | Status: AC
  Administered 2023-12-11: 650 mg via ORAL
  Filled 2023-12-11: qty 2

## 2023-12-11 NOTE — Patient Instructions (Signed)
 Getting Platelets Through an IV (Platelet Transfusion) in Adults: What to Expect A platelet transfusion is when a person gets platelets from another person (donor) through an IV. Platelets are parts of blood that stick together and form a clot to help stop bleeding after an injury. If you don't have enough platelets, you might bleed or bruise easily. You may need a platelet transfusion if you have a health problem that causes a low number of platelets, such as thrombocytopenia. A platelet transfusion may be used to stop or prevent too much bleeding. Tell a health care provider about: Any reactions you've had during past transfusions. Any allergies you have. All medicines you take. These include vitamins, herbs, eye drops, and creams. Any bleeding problems you have. Any surgeries you've had. Any health problems you have. Whether you're pregnant or may be pregnant. What are the risks? Your health care provider will talk with you about risks. These may include: Fever or chills. This might happen if your body reacts to the new cells. It can happen during or up to 4 hours after the transfusion. A mild allergy. You might get red, swollen areas on your skin (hives). You might feel itchy. A bad allergy. You might have trouble breathing or swelling around your face and lips. Very bad reactions are rare but can happen. These may be: Infection. This is rare because donated blood is carefully tested. Hemolytic reaction. This is when the defense system (immune system) of your body attacks the new cells. Symptoms include fever, chills, and a feeling that you may throw up. You may also have low blood pressure and pain in your chest or lower back. Transfusion-associated circulatory overload (TACO). This happens if fluids move to your lungs. This may cause breathing problems. Transfusion-related acute lung injury (TRALI). TRALI can cause breathing problems and low oxygen in your blood. This can happen within  hours or days after the transfusion. Transfusion-associated graft-versus-host disease (TAGVHD). This happens when donated cells attack the body's healthy tissues. What happens before? Take your medicines only as told. You will have a blood test to find out your blood type. This helps match the donor blood with your blood. If you've had an allergy to a transfusion in the past, you may be given medicine to help prevent another allergy. Your temperature, blood pressure, pulse, and breathing will be checked. What happens during platelet transfusion?  An IV will be put into a vein in your hand or arm. For your safety, two health care team members will check your identity and the donor platelets. The bag of donor platelets will be connected to your IV. The platelets will flow into your blood. This usually takes 30-60 minutes. Your temperature, blood pressure, pulse, and breathing will be checked. This helps catch signs of a reaction early. You will be watched for symptoms of all types of reactions, like chills, hives, or itching. If you show signs of a reaction, the transfusion will be stopped. You may be given medicine to help treat the reaction. When your transfusion is done, your IV will be removed. Pressure may be put on the IV site for a few minutes to stop any bleeding. The IV site will be covered with a bandage. These steps may vary. Ask what you can expect. What happens after? You will be watched closely until you leave. This includes checking your blood pressure, temperature, pulse rate, and breathing rate again. This information is not intended to replace advice given to you by your health care  provider. Make sure you discuss any questions you have with your health care provider. Document Revised: 05/16/2023 Document Reviewed: 05/16/2023 Elsevier Patient Education  2025 [employer].

## 2023-12-11 NOTE — Progress Notes (Signed)
 Pt presents to cancer center for PLT transfusion. Pt tolerated transfusion well with no signs of complications. Transfusion given per protocol. Vitals stable pre, during, and post transfusion. RN educated pt on the importance of notifying the clinic if any complications occur and when to seek emergency care. Pt and pt.'s son verbalized understanding and all questions answered at this time. All follow ups as scheduled.   Ian Ryan

## 2023-12-12 LAB — PREPARE PLATELET PHERESIS: Unit division: 0

## 2023-12-12 LAB — BPAM PLATELET PHERESIS
Blood Product Expiration Date: 202511022359
ISSUE DATE / TIME: 202510300918
Unit Type and Rh: 6200

## 2023-12-17 ENCOUNTER — Inpatient Hospital Stay

## 2023-12-17 ENCOUNTER — Other Ambulatory Visit: Payer: Self-pay

## 2023-12-17 DIAGNOSIS — D61818 Other pancytopenia: Secondary | ICD-10-CM

## 2023-12-22 ENCOUNTER — Other Ambulatory Visit: Payer: Self-pay | Admitting: *Deleted

## 2023-12-22 MED ORDER — TRAZODONE HCL 50 MG PO TABS: 50.0000 mg | ORAL_TABLET | Freq: Every day | ORAL | 2 refills | Status: AC

## 2023-12-29 NOTE — Progress Notes (Signed)
 Encounter error

## 2023-12-31 ENCOUNTER — Inpatient Hospital Stay

## 2024-02-09 ENCOUNTER — Encounter: Payer: Self-pay | Admitting: *Deleted

## 2024-03-11 ENCOUNTER — Encounter: Payer: Self-pay | Admitting: *Deleted
